# Patient Record
Sex: Male | Born: 1950 | Race: Black or African American | Hispanic: No | Smoking: Never smoker
Health system: Southern US, Community
[De-identification: ages and names within clinical notes are randomized; demographics above are authoritative.]

## PROBLEM LIST (undated history)

## (undated) DIAGNOSIS — K4090 Unilateral inguinal hernia, without obstruction or gangrene, not specified as recurrent: Secondary | ICD-10-CM

## (undated) DIAGNOSIS — J309 Allergic rhinitis, unspecified: Secondary | ICD-10-CM

## (undated) DIAGNOSIS — H409 Unspecified glaucoma: Secondary | ICD-10-CM

## (undated) DIAGNOSIS — R718 Other abnormality of red blood cells: Secondary | ICD-10-CM

## (undated) DIAGNOSIS — M1990 Unspecified osteoarthritis, unspecified site: Secondary | ICD-10-CM

## (undated) DIAGNOSIS — G4700 Insomnia, unspecified: Secondary | ICD-10-CM

## (undated) DIAGNOSIS — D563 Thalassemia minor: Secondary | ICD-10-CM

## (undated) DIAGNOSIS — E213 Hyperparathyroidism, unspecified: Secondary | ICD-10-CM

## (undated) DIAGNOSIS — H34232 Retinal artery branch occlusion, left eye: Secondary | ICD-10-CM

## (undated) DIAGNOSIS — C61 Malignant neoplasm of prostate: Secondary | ICD-10-CM

## (undated) DIAGNOSIS — Z87442 Personal history of urinary calculi: Secondary | ICD-10-CM

## (undated) DIAGNOSIS — I10 Essential (primary) hypertension: Secondary | ICD-10-CM

## (undated) DIAGNOSIS — N189 Chronic kidney disease, unspecified: Secondary | ICD-10-CM

## (undated) DIAGNOSIS — H548 Legal blindness, as defined in USA: Secondary | ICD-10-CM

## (undated) DIAGNOSIS — E785 Hyperlipidemia, unspecified: Secondary | ICD-10-CM

## (undated) DIAGNOSIS — R972 Elevated prostate specific antigen [PSA]: Secondary | ICD-10-CM

## (undated) DIAGNOSIS — D72820 Lymphocytosis (symptomatic): Secondary | ICD-10-CM

## (undated) DIAGNOSIS — N402 Nodular prostate without lower urinary tract symptoms: Secondary | ICD-10-CM

## (undated) DIAGNOSIS — E559 Vitamin D deficiency, unspecified: Secondary | ICD-10-CM

## (undated) HISTORY — DX: Malignant neoplasm of prostate: C61

## (undated) HISTORY — DX: Nodular prostate without lower urinary tract symptoms: N40.2

## (undated) HISTORY — DX: Chronic kidney disease, unspecified: N18.9

## (undated) HISTORY — DX: Unspecified glaucoma: H40.9

## (undated) HISTORY — DX: Hyperparathyroidism, unspecified: E21.3

## (undated) HISTORY — DX: Legal blindness, as defined in USA: H54.8

## (undated) HISTORY — DX: Essential (primary) hypertension: I10

## (undated) HISTORY — DX: Other abnormality of red blood cells: R71.8

## (undated) HISTORY — DX: Personal history of urinary calculi: Z87.442

## (undated) HISTORY — DX: Vitamin D deficiency, unspecified: E55.9

## (undated) HISTORY — DX: Elevated prostate specific antigen (PSA): R97.20

## (undated) HISTORY — DX: Unilateral inguinal hernia, without obstruction or gangrene, not specified as recurrent: K40.90

## (undated) HISTORY — PX: NO PAST SURGERIES: SHX2092

## (undated) HISTORY — DX: Hyperlipidemia, unspecified: E78.5

## (undated) HISTORY — DX: Lymphocytosis (symptomatic): D72.820

## (undated) HISTORY — DX: Other disorders of bilirubin metabolism: E80.6

---

## 2013-12-28 DIAGNOSIS — H409 Unspecified glaucoma: Secondary | ICD-10-CM | POA: Insufficient documentation

## 2014-05-30 DIAGNOSIS — H4010X0 Unspecified open-angle glaucoma, stage unspecified: Secondary | ICD-10-CM | POA: Diagnosis not present

## 2014-07-10 DIAGNOSIS — I10 Essential (primary) hypertension: Secondary | ICD-10-CM | POA: Diagnosis not present

## 2014-07-10 DIAGNOSIS — Z0000 Encounter for general adult medical examination without abnormal findings: Secondary | ICD-10-CM | POA: Diagnosis not present

## 2014-07-10 DIAGNOSIS — E559 Vitamin D deficiency, unspecified: Secondary | ICD-10-CM | POA: Diagnosis not present

## 2014-07-10 DIAGNOSIS — J019 Acute sinusitis, unspecified: Secondary | ICD-10-CM | POA: Diagnosis not present

## 2014-07-10 DIAGNOSIS — E780 Pure hypercholesterolemia: Secondary | ICD-10-CM | POA: Diagnosis not present

## 2014-07-10 DIAGNOSIS — R195 Other fecal abnormalities: Secondary | ICD-10-CM | POA: Diagnosis not present

## 2014-07-10 DIAGNOSIS — Z9181 History of falling: Secondary | ICD-10-CM | POA: Diagnosis not present

## 2014-07-10 DIAGNOSIS — R972 Elevated prostate specific antigen [PSA]: Secondary | ICD-10-CM | POA: Diagnosis not present

## 2014-07-20 DIAGNOSIS — R972 Elevated prostate specific antigen [PSA]: Secondary | ICD-10-CM | POA: Diagnosis not present

## 2014-07-20 DIAGNOSIS — K4090 Unilateral inguinal hernia, without obstruction or gangrene, not specified as recurrent: Secondary | ICD-10-CM | POA: Diagnosis not present

## 2014-07-20 DIAGNOSIS — N402 Nodular prostate without lower urinary tract symptoms: Secondary | ICD-10-CM | POA: Diagnosis not present

## 2014-07-23 DIAGNOSIS — K828 Other specified diseases of gallbladder: Secondary | ICD-10-CM | POA: Diagnosis not present

## 2014-07-23 DIAGNOSIS — K7689 Other specified diseases of liver: Secondary | ICD-10-CM | POA: Diagnosis not present

## 2014-08-07 DIAGNOSIS — C61 Malignant neoplasm of prostate: Secondary | ICD-10-CM | POA: Diagnosis not present

## 2014-08-07 DIAGNOSIS — R972 Elevated prostate specific antigen [PSA]: Secondary | ICD-10-CM | POA: Diagnosis not present

## 2014-08-17 DIAGNOSIS — C61 Malignant neoplasm of prostate: Secondary | ICD-10-CM | POA: Diagnosis not present

## 2014-08-17 DIAGNOSIS — K4090 Unilateral inguinal hernia, without obstruction or gangrene, not specified as recurrent: Secondary | ICD-10-CM | POA: Diagnosis not present

## 2014-08-21 ENCOUNTER — Ambulatory Visit: Payer: Self-pay | Admitting: Urology

## 2014-08-21 DIAGNOSIS — N200 Calculus of kidney: Secondary | ICD-10-CM | POA: Diagnosis not present

## 2014-08-21 DIAGNOSIS — K4090 Unilateral inguinal hernia, without obstruction or gangrene, not specified as recurrent: Secondary | ICD-10-CM | POA: Diagnosis not present

## 2014-08-21 DIAGNOSIS — C61 Malignant neoplasm of prostate: Secondary | ICD-10-CM | POA: Diagnosis not present

## 2014-08-29 DIAGNOSIS — H4011X3 Primary open-angle glaucoma, severe stage: Secondary | ICD-10-CM | POA: Diagnosis not present

## 2014-09-06 ENCOUNTER — Ambulatory Visit: Payer: Self-pay | Admitting: Oncology

## 2014-09-06 DIAGNOSIS — Z7982 Long term (current) use of aspirin: Secondary | ICD-10-CM | POA: Diagnosis not present

## 2014-09-06 DIAGNOSIS — C61 Malignant neoplasm of prostate: Secondary | ICD-10-CM | POA: Diagnosis not present

## 2014-09-06 DIAGNOSIS — Z79899 Other long term (current) drug therapy: Secondary | ICD-10-CM | POA: Diagnosis not present

## 2014-09-06 DIAGNOSIS — R350 Frequency of micturition: Secondary | ICD-10-CM | POA: Diagnosis not present

## 2014-09-06 DIAGNOSIS — N3941 Urge incontinence: Secondary | ICD-10-CM | POA: Diagnosis not present

## 2014-09-06 DIAGNOSIS — E213 Hyperparathyroidism, unspecified: Secondary | ICD-10-CM | POA: Diagnosis not present

## 2014-09-06 DIAGNOSIS — N289 Disorder of kidney and ureter, unspecified: Secondary | ICD-10-CM | POA: Diagnosis not present

## 2014-09-06 DIAGNOSIS — F419 Anxiety disorder, unspecified: Secondary | ICD-10-CM | POA: Diagnosis not present

## 2014-09-06 DIAGNOSIS — R351 Nocturia: Secondary | ICD-10-CM | POA: Diagnosis not present

## 2014-09-06 DIAGNOSIS — Z79818 Long term (current) use of other agents affecting estrogen receptors and estrogen levels: Secondary | ICD-10-CM | POA: Diagnosis not present

## 2014-09-06 DIAGNOSIS — R972 Elevated prostate specific antigen [PSA]: Secondary | ICD-10-CM | POA: Diagnosis not present

## 2014-09-06 LAB — COMPREHENSIVE METABOLIC PANEL
AST: 25 U/L (ref 15–37)
Albumin: 3.8 g/dL (ref 3.4–5.0)
Alkaline Phosphatase: 45 U/L — ABNORMAL LOW
Anion Gap: 9 (ref 7–16)
BUN: 36 mg/dL — ABNORMAL HIGH (ref 7–18)
Bilirubin,Total: 2.9 mg/dL — ABNORMAL HIGH (ref 0.2–1.0)
CREATININE: 2.09 mg/dL — AB (ref 0.60–1.30)
Calcium, Total: 10.9 mg/dL — ABNORMAL HIGH (ref 8.5–10.1)
Chloride: 104 mmol/L (ref 98–107)
Co2: 29 mmol/L (ref 21–32)
EGFR (Non-African Amer.): 34 — ABNORMAL LOW
GFR CALC AF AMER: 41 — AB
Glucose: 109 mg/dL — ABNORMAL HIGH (ref 65–99)
OSMOLALITY: 292 (ref 275–301)
Potassium: 3.9 mmol/L (ref 3.5–5.1)
SGPT (ALT): 52 U/L
SODIUM: 142 mmol/L (ref 136–145)
TOTAL PROTEIN: 8 g/dL (ref 6.4–8.2)

## 2014-09-06 LAB — CBC CANCER CENTER
BASOS PCT: 1.5 %
Basophil #: 0.1 x10 3/mm (ref 0.0–0.1)
EOS ABS: 0.3 x10 3/mm (ref 0.0–0.7)
EOS PCT: 5 %
HCT: 46.8 % (ref 40.0–52.0)
HGB: 14.1 g/dL (ref 13.0–18.0)
LYMPHS PCT: 33.1 %
Lymphocyte #: 2.2 x10 3/mm (ref 1.0–3.6)
MCH: 21 pg — ABNORMAL LOW (ref 26.0–34.0)
MCHC: 30.2 g/dL — AB (ref 32.0–36.0)
MCV: 70 fL — AB (ref 80–100)
Monocyte #: 0.6 x10 3/mm (ref 0.2–1.0)
Monocyte %: 8.3 %
Neutrophil #: 3.5 x10 3/mm (ref 1.4–6.5)
Neutrophil %: 52.1 %
Platelet: 190 x10 3/mm (ref 150–440)
RBC: 6.73 10*6/uL — ABNORMAL HIGH (ref 4.40–5.90)
RDW: 15.8 % — AB (ref 11.5–14.5)
WBC: 6.7 x10 3/mm (ref 3.8–10.6)

## 2014-09-07 LAB — PSA: PSA: 24.6 ng/mL — ABNORMAL HIGH (ref 0.0–4.0)

## 2014-09-28 ENCOUNTER — Ambulatory Visit: Payer: Self-pay | Admitting: Oncology

## 2014-09-28 DIAGNOSIS — Z7982 Long term (current) use of aspirin: Secondary | ICD-10-CM | POA: Diagnosis not present

## 2014-09-28 DIAGNOSIS — R351 Nocturia: Secondary | ICD-10-CM | POA: Diagnosis not present

## 2014-09-28 DIAGNOSIS — R972 Elevated prostate specific antigen [PSA]: Secondary | ICD-10-CM | POA: Diagnosis not present

## 2014-09-28 DIAGNOSIS — R5383 Other fatigue: Secondary | ICD-10-CM | POA: Diagnosis not present

## 2014-09-28 DIAGNOSIS — Z79818 Long term (current) use of other agents affecting estrogen receptors and estrogen levels: Secondary | ICD-10-CM | POA: Diagnosis not present

## 2014-09-28 DIAGNOSIS — E213 Hyperparathyroidism, unspecified: Secondary | ICD-10-CM | POA: Diagnosis not present

## 2014-09-28 DIAGNOSIS — N289 Disorder of kidney and ureter, unspecified: Secondary | ICD-10-CM | POA: Diagnosis not present

## 2014-09-28 DIAGNOSIS — Z79899 Other long term (current) drug therapy: Secondary | ICD-10-CM | POA: Diagnosis not present

## 2014-09-28 DIAGNOSIS — R531 Weakness: Secondary | ICD-10-CM | POA: Diagnosis not present

## 2014-09-28 DIAGNOSIS — F419 Anxiety disorder, unspecified: Secondary | ICD-10-CM | POA: Diagnosis not present

## 2014-09-28 DIAGNOSIS — C61 Malignant neoplasm of prostate: Secondary | ICD-10-CM | POA: Diagnosis not present

## 2014-09-28 DIAGNOSIS — Z510 Encounter for antineoplastic radiation therapy: Secondary | ICD-10-CM | POA: Diagnosis not present

## 2014-09-28 DIAGNOSIS — Z8546 Personal history of malignant neoplasm of prostate: Secondary | ICD-10-CM | POA: Diagnosis not present

## 2014-10-04 DIAGNOSIS — H4011X3 Primary open-angle glaucoma, severe stage: Secondary | ICD-10-CM | POA: Diagnosis not present

## 2014-10-09 DIAGNOSIS — C61 Malignant neoplasm of prostate: Secondary | ICD-10-CM | POA: Diagnosis not present

## 2014-10-11 DIAGNOSIS — N289 Disorder of kidney and ureter, unspecified: Secondary | ICD-10-CM | POA: Diagnosis not present

## 2014-10-11 DIAGNOSIS — Z510 Encounter for antineoplastic radiation therapy: Secondary | ICD-10-CM | POA: Diagnosis not present

## 2014-10-11 DIAGNOSIS — Z79818 Long term (current) use of other agents affecting estrogen receptors and estrogen levels: Secondary | ICD-10-CM | POA: Diagnosis not present

## 2014-10-11 DIAGNOSIS — R972 Elevated prostate specific antigen [PSA]: Secondary | ICD-10-CM | POA: Diagnosis not present

## 2014-10-11 DIAGNOSIS — C61 Malignant neoplasm of prostate: Secondary | ICD-10-CM | POA: Diagnosis not present

## 2014-10-11 LAB — COMPREHENSIVE METABOLIC PANEL
Albumin: 3.5 g/dL (ref 3.4–5.0)
Alkaline Phosphatase: 49 U/L
Anion Gap: 10 (ref 7–16)
BUN: 49 mg/dL — ABNORMAL HIGH (ref 7–18)
Bilirubin,Total: 2.6 mg/dL — ABNORMAL HIGH (ref 0.2–1.0)
Calcium, Total: 10.2 mg/dL — ABNORMAL HIGH (ref 8.5–10.1)
Chloride: 110 mmol/L — ABNORMAL HIGH (ref 98–107)
Co2: 23 mmol/L (ref 21–32)
Creatinine: 2.29 mg/dL — ABNORMAL HIGH (ref 0.60–1.30)
EGFR (African American): 37 — ABNORMAL LOW
EGFR (Non-African Amer.): 31 — ABNORMAL LOW
Glucose: 104 mg/dL — ABNORMAL HIGH (ref 65–99)
Osmolality: 298 (ref 275–301)
Potassium: 4.1 mmol/L (ref 3.5–5.1)
SGOT(AST): 21 U/L (ref 15–37)
SGPT (ALT): 41 U/L
Sodium: 143 mmol/L (ref 136–145)
Total Protein: 7.7 g/dL (ref 6.4–8.2)

## 2014-10-11 LAB — CBC CANCER CENTER
BASOS PCT: 1.3 %
Basophil #: 0.1 x10 3/mm (ref 0.0–0.1)
EOS PCT: 3.7 %
Eosinophil #: 0.2 x10 3/mm (ref 0.0–0.7)
HCT: 43 % (ref 40.0–52.0)
HGB: 13.5 g/dL (ref 13.0–18.0)
Lymphocyte #: 2.5 x10 3/mm (ref 1.0–3.6)
Lymphocyte %: 38.4 %
MCH: 21.3 pg — AB (ref 26.0–34.0)
MCHC: 31.3 g/dL — ABNORMAL LOW (ref 32.0–36.0)
MCV: 68 fL — AB (ref 80–100)
MONO ABS: 0.5 x10 3/mm (ref 0.2–1.0)
Monocyte %: 7.9 %
Neutrophil #: 3.1 x10 3/mm (ref 1.4–6.5)
Neutrophil %: 48.7 %
Platelet: 204 x10 3/mm (ref 150–440)
RBC: 6.33 10*6/uL — ABNORMAL HIGH (ref 4.40–5.90)
RDW: 15.4 % — ABNORMAL HIGH (ref 11.5–14.5)
WBC: 6.4 x10 3/mm (ref 3.8–10.6)

## 2014-10-15 LAB — PSA: PSA: 12.8 ng/mL — ABNORMAL HIGH (ref 0.0–4.0)

## 2014-10-16 DIAGNOSIS — Z510 Encounter for antineoplastic radiation therapy: Secondary | ICD-10-CM | POA: Diagnosis not present

## 2014-10-16 DIAGNOSIS — N289 Disorder of kidney and ureter, unspecified: Secondary | ICD-10-CM | POA: Diagnosis not present

## 2014-10-16 DIAGNOSIS — Z79818 Long term (current) use of other agents affecting estrogen receptors and estrogen levels: Secondary | ICD-10-CM | POA: Diagnosis not present

## 2014-10-16 DIAGNOSIS — R972 Elevated prostate specific antigen [PSA]: Secondary | ICD-10-CM | POA: Diagnosis not present

## 2014-10-16 DIAGNOSIS — C61 Malignant neoplasm of prostate: Secondary | ICD-10-CM | POA: Diagnosis not present

## 2014-10-17 DIAGNOSIS — R972 Elevated prostate specific antigen [PSA]: Secondary | ICD-10-CM | POA: Diagnosis not present

## 2014-10-17 DIAGNOSIS — Z510 Encounter for antineoplastic radiation therapy: Secondary | ICD-10-CM | POA: Diagnosis not present

## 2014-10-17 DIAGNOSIS — C61 Malignant neoplasm of prostate: Secondary | ICD-10-CM | POA: Diagnosis not present

## 2014-10-17 DIAGNOSIS — Z79818 Long term (current) use of other agents affecting estrogen receptors and estrogen levels: Secondary | ICD-10-CM | POA: Diagnosis not present

## 2014-10-17 DIAGNOSIS — N289 Disorder of kidney and ureter, unspecified: Secondary | ICD-10-CM | POA: Diagnosis not present

## 2014-10-22 DIAGNOSIS — Z79818 Long term (current) use of other agents affecting estrogen receptors and estrogen levels: Secondary | ICD-10-CM | POA: Diagnosis not present

## 2014-10-22 DIAGNOSIS — N289 Disorder of kidney and ureter, unspecified: Secondary | ICD-10-CM | POA: Diagnosis not present

## 2014-10-22 DIAGNOSIS — C61 Malignant neoplasm of prostate: Secondary | ICD-10-CM | POA: Diagnosis not present

## 2014-10-22 DIAGNOSIS — Z510 Encounter for antineoplastic radiation therapy: Secondary | ICD-10-CM | POA: Diagnosis not present

## 2014-10-22 DIAGNOSIS — R972 Elevated prostate specific antigen [PSA]: Secondary | ICD-10-CM | POA: Diagnosis not present

## 2014-10-26 DIAGNOSIS — N289 Disorder of kidney and ureter, unspecified: Secondary | ICD-10-CM | POA: Diagnosis not present

## 2014-10-26 DIAGNOSIS — Z510 Encounter for antineoplastic radiation therapy: Secondary | ICD-10-CM | POA: Diagnosis not present

## 2014-10-26 DIAGNOSIS — R972 Elevated prostate specific antigen [PSA]: Secondary | ICD-10-CM | POA: Diagnosis not present

## 2014-10-26 DIAGNOSIS — C61 Malignant neoplasm of prostate: Secondary | ICD-10-CM | POA: Diagnosis not present

## 2014-10-26 DIAGNOSIS — Z79818 Long term (current) use of other agents affecting estrogen receptors and estrogen levels: Secondary | ICD-10-CM | POA: Diagnosis not present

## 2014-10-29 ENCOUNTER — Ambulatory Visit: Payer: Self-pay | Admitting: Oncology

## 2014-10-29 DIAGNOSIS — C61 Malignant neoplasm of prostate: Secondary | ICD-10-CM | POA: Diagnosis not present

## 2014-10-29 DIAGNOSIS — Z510 Encounter for antineoplastic radiation therapy: Secondary | ICD-10-CM | POA: Diagnosis not present

## 2014-10-29 DIAGNOSIS — Z79818 Long term (current) use of other agents affecting estrogen receptors and estrogen levels: Secondary | ICD-10-CM | POA: Diagnosis not present

## 2014-10-30 DIAGNOSIS — Z510 Encounter for antineoplastic radiation therapy: Secondary | ICD-10-CM | POA: Diagnosis not present

## 2014-10-30 DIAGNOSIS — C61 Malignant neoplasm of prostate: Secondary | ICD-10-CM | POA: Diagnosis not present

## 2014-10-30 DIAGNOSIS — Z79818 Long term (current) use of other agents affecting estrogen receptors and estrogen levels: Secondary | ICD-10-CM | POA: Diagnosis not present

## 2014-10-31 DIAGNOSIS — Z510 Encounter for antineoplastic radiation therapy: Secondary | ICD-10-CM | POA: Diagnosis not present

## 2014-10-31 DIAGNOSIS — Z79818 Long term (current) use of other agents affecting estrogen receptors and estrogen levels: Secondary | ICD-10-CM | POA: Diagnosis not present

## 2014-10-31 DIAGNOSIS — C61 Malignant neoplasm of prostate: Secondary | ICD-10-CM | POA: Diagnosis not present

## 2014-11-01 DIAGNOSIS — Z79818 Long term (current) use of other agents affecting estrogen receptors and estrogen levels: Secondary | ICD-10-CM | POA: Diagnosis not present

## 2014-11-01 DIAGNOSIS — Z510 Encounter for antineoplastic radiation therapy: Secondary | ICD-10-CM | POA: Diagnosis not present

## 2014-11-01 DIAGNOSIS — C61 Malignant neoplasm of prostate: Secondary | ICD-10-CM | POA: Diagnosis not present

## 2014-11-05 DIAGNOSIS — C61 Malignant neoplasm of prostate: Secondary | ICD-10-CM | POA: Diagnosis not present

## 2014-11-05 DIAGNOSIS — Z510 Encounter for antineoplastic radiation therapy: Secondary | ICD-10-CM | POA: Diagnosis not present

## 2014-11-05 DIAGNOSIS — Z79818 Long term (current) use of other agents affecting estrogen receptors and estrogen levels: Secondary | ICD-10-CM | POA: Diagnosis not present

## 2014-11-06 DIAGNOSIS — C61 Malignant neoplasm of prostate: Secondary | ICD-10-CM | POA: Diagnosis not present

## 2014-11-06 DIAGNOSIS — Z79818 Long term (current) use of other agents affecting estrogen receptors and estrogen levels: Secondary | ICD-10-CM | POA: Diagnosis not present

## 2014-11-06 DIAGNOSIS — Z510 Encounter for antineoplastic radiation therapy: Secondary | ICD-10-CM | POA: Diagnosis not present

## 2014-11-07 DIAGNOSIS — C61 Malignant neoplasm of prostate: Secondary | ICD-10-CM | POA: Diagnosis not present

## 2014-11-07 DIAGNOSIS — Z79818 Long term (current) use of other agents affecting estrogen receptors and estrogen levels: Secondary | ICD-10-CM | POA: Diagnosis not present

## 2014-11-07 DIAGNOSIS — Z510 Encounter for antineoplastic radiation therapy: Secondary | ICD-10-CM | POA: Diagnosis not present

## 2014-11-08 DIAGNOSIS — C61 Malignant neoplasm of prostate: Secondary | ICD-10-CM | POA: Diagnosis not present

## 2014-11-08 DIAGNOSIS — Z510 Encounter for antineoplastic radiation therapy: Secondary | ICD-10-CM | POA: Diagnosis not present

## 2014-11-08 DIAGNOSIS — Z79818 Long term (current) use of other agents affecting estrogen receptors and estrogen levels: Secondary | ICD-10-CM | POA: Diagnosis not present

## 2014-11-09 DIAGNOSIS — C61 Malignant neoplasm of prostate: Secondary | ICD-10-CM | POA: Diagnosis not present

## 2014-11-09 DIAGNOSIS — Z79818 Long term (current) use of other agents affecting estrogen receptors and estrogen levels: Secondary | ICD-10-CM | POA: Diagnosis not present

## 2014-11-09 DIAGNOSIS — Z510 Encounter for antineoplastic radiation therapy: Secondary | ICD-10-CM | POA: Diagnosis not present

## 2014-11-13 DIAGNOSIS — C61 Malignant neoplasm of prostate: Secondary | ICD-10-CM | POA: Diagnosis not present

## 2014-11-13 DIAGNOSIS — Z79818 Long term (current) use of other agents affecting estrogen receptors and estrogen levels: Secondary | ICD-10-CM | POA: Diagnosis not present

## 2014-11-13 DIAGNOSIS — Z510 Encounter for antineoplastic radiation therapy: Secondary | ICD-10-CM | POA: Diagnosis not present

## 2014-11-14 DIAGNOSIS — C61 Malignant neoplasm of prostate: Secondary | ICD-10-CM | POA: Diagnosis not present

## 2014-11-14 DIAGNOSIS — Z510 Encounter for antineoplastic radiation therapy: Secondary | ICD-10-CM | POA: Diagnosis not present

## 2014-11-14 DIAGNOSIS — Z79818 Long term (current) use of other agents affecting estrogen receptors and estrogen levels: Secondary | ICD-10-CM | POA: Diagnosis not present

## 2014-11-15 DIAGNOSIS — Z510 Encounter for antineoplastic radiation therapy: Secondary | ICD-10-CM | POA: Diagnosis not present

## 2014-11-15 DIAGNOSIS — C61 Malignant neoplasm of prostate: Secondary | ICD-10-CM | POA: Diagnosis not present

## 2014-11-15 DIAGNOSIS — Z79818 Long term (current) use of other agents affecting estrogen receptors and estrogen levels: Secondary | ICD-10-CM | POA: Diagnosis not present

## 2014-11-16 DIAGNOSIS — Z510 Encounter for antineoplastic radiation therapy: Secondary | ICD-10-CM | POA: Diagnosis not present

## 2014-11-16 DIAGNOSIS — C61 Malignant neoplasm of prostate: Secondary | ICD-10-CM | POA: Diagnosis not present

## 2014-11-16 DIAGNOSIS — Z79818 Long term (current) use of other agents affecting estrogen receptors and estrogen levels: Secondary | ICD-10-CM | POA: Diagnosis not present

## 2014-11-19 DIAGNOSIS — Z510 Encounter for antineoplastic radiation therapy: Secondary | ICD-10-CM | POA: Diagnosis not present

## 2014-11-19 DIAGNOSIS — Z79818 Long term (current) use of other agents affecting estrogen receptors and estrogen levels: Secondary | ICD-10-CM | POA: Diagnosis not present

## 2014-11-19 DIAGNOSIS — C61 Malignant neoplasm of prostate: Secondary | ICD-10-CM | POA: Diagnosis not present

## 2014-11-20 DIAGNOSIS — Z79818 Long term (current) use of other agents affecting estrogen receptors and estrogen levels: Secondary | ICD-10-CM | POA: Diagnosis not present

## 2014-11-20 DIAGNOSIS — Z510 Encounter for antineoplastic radiation therapy: Secondary | ICD-10-CM | POA: Diagnosis not present

## 2014-11-20 DIAGNOSIS — C61 Malignant neoplasm of prostate: Secondary | ICD-10-CM | POA: Diagnosis not present

## 2014-11-21 DIAGNOSIS — Z79818 Long term (current) use of other agents affecting estrogen receptors and estrogen levels: Secondary | ICD-10-CM | POA: Diagnosis not present

## 2014-11-21 DIAGNOSIS — Z510 Encounter for antineoplastic radiation therapy: Secondary | ICD-10-CM | POA: Diagnosis not present

## 2014-11-21 DIAGNOSIS — C61 Malignant neoplasm of prostate: Secondary | ICD-10-CM | POA: Diagnosis not present

## 2014-11-22 DIAGNOSIS — C61 Malignant neoplasm of prostate: Secondary | ICD-10-CM | POA: Diagnosis not present

## 2014-11-22 DIAGNOSIS — Z79818 Long term (current) use of other agents affecting estrogen receptors and estrogen levels: Secondary | ICD-10-CM | POA: Diagnosis not present

## 2014-11-22 DIAGNOSIS — Z510 Encounter for antineoplastic radiation therapy: Secondary | ICD-10-CM | POA: Diagnosis not present

## 2014-11-23 DIAGNOSIS — Z510 Encounter for antineoplastic radiation therapy: Secondary | ICD-10-CM | POA: Diagnosis not present

## 2014-11-23 DIAGNOSIS — Z79818 Long term (current) use of other agents affecting estrogen receptors and estrogen levels: Secondary | ICD-10-CM | POA: Diagnosis not present

## 2014-11-23 DIAGNOSIS — C61 Malignant neoplasm of prostate: Secondary | ICD-10-CM | POA: Diagnosis not present

## 2014-11-26 DIAGNOSIS — Z510 Encounter for antineoplastic radiation therapy: Secondary | ICD-10-CM | POA: Diagnosis not present

## 2014-11-26 DIAGNOSIS — C61 Malignant neoplasm of prostate: Secondary | ICD-10-CM | POA: Diagnosis not present

## 2014-11-26 DIAGNOSIS — Z79818 Long term (current) use of other agents affecting estrogen receptors and estrogen levels: Secondary | ICD-10-CM | POA: Diagnosis not present

## 2014-11-27 ENCOUNTER — Ambulatory Visit
Admit: 2014-11-27 | Disposition: A | Discharge status: Home / Self Care | Payer: Self-pay | Attending: Oncology | Admitting: Oncology

## 2014-11-27 DIAGNOSIS — C61 Malignant neoplasm of prostate: Secondary | ICD-10-CM | POA: Diagnosis not present

## 2014-11-27 DIAGNOSIS — Z510 Encounter for antineoplastic radiation therapy: Secondary | ICD-10-CM | POA: Diagnosis not present

## 2014-11-27 DIAGNOSIS — Z79818 Long term (current) use of other agents affecting estrogen receptors and estrogen levels: Secondary | ICD-10-CM | POA: Diagnosis not present

## 2014-11-28 DIAGNOSIS — Z510 Encounter for antineoplastic radiation therapy: Secondary | ICD-10-CM | POA: Diagnosis not present

## 2014-11-28 DIAGNOSIS — C61 Malignant neoplasm of prostate: Secondary | ICD-10-CM | POA: Diagnosis not present

## 2014-11-28 DIAGNOSIS — Z79818 Long term (current) use of other agents affecting estrogen receptors and estrogen levels: Secondary | ICD-10-CM | POA: Diagnosis not present

## 2014-11-29 DIAGNOSIS — Z510 Encounter for antineoplastic radiation therapy: Secondary | ICD-10-CM | POA: Diagnosis not present

## 2014-11-29 DIAGNOSIS — Z79818 Long term (current) use of other agents affecting estrogen receptors and estrogen levels: Secondary | ICD-10-CM | POA: Diagnosis not present

## 2014-11-29 DIAGNOSIS — C61 Malignant neoplasm of prostate: Secondary | ICD-10-CM | POA: Diagnosis not present

## 2014-11-30 DIAGNOSIS — Z79818 Long term (current) use of other agents affecting estrogen receptors and estrogen levels: Secondary | ICD-10-CM | POA: Diagnosis not present

## 2014-11-30 DIAGNOSIS — C61 Malignant neoplasm of prostate: Secondary | ICD-10-CM | POA: Diagnosis not present

## 2014-11-30 DIAGNOSIS — Z510 Encounter for antineoplastic radiation therapy: Secondary | ICD-10-CM | POA: Diagnosis not present

## 2014-12-03 DIAGNOSIS — C61 Malignant neoplasm of prostate: Secondary | ICD-10-CM | POA: Diagnosis not present

## 2014-12-03 DIAGNOSIS — Z510 Encounter for antineoplastic radiation therapy: Secondary | ICD-10-CM | POA: Diagnosis not present

## 2014-12-03 DIAGNOSIS — Z79818 Long term (current) use of other agents affecting estrogen receptors and estrogen levels: Secondary | ICD-10-CM | POA: Diagnosis not present

## 2014-12-04 DIAGNOSIS — Z79818 Long term (current) use of other agents affecting estrogen receptors and estrogen levels: Secondary | ICD-10-CM | POA: Diagnosis not present

## 2014-12-04 DIAGNOSIS — Z510 Encounter for antineoplastic radiation therapy: Secondary | ICD-10-CM | POA: Diagnosis not present

## 2014-12-04 DIAGNOSIS — C61 Malignant neoplasm of prostate: Secondary | ICD-10-CM | POA: Diagnosis not present

## 2014-12-05 DIAGNOSIS — Z510 Encounter for antineoplastic radiation therapy: Secondary | ICD-10-CM | POA: Diagnosis not present

## 2014-12-05 DIAGNOSIS — C61 Malignant neoplasm of prostate: Secondary | ICD-10-CM | POA: Diagnosis not present

## 2014-12-05 DIAGNOSIS — Z79818 Long term (current) use of other agents affecting estrogen receptors and estrogen levels: Secondary | ICD-10-CM | POA: Diagnosis not present

## 2014-12-06 DIAGNOSIS — Z79818 Long term (current) use of other agents affecting estrogen receptors and estrogen levels: Secondary | ICD-10-CM | POA: Diagnosis not present

## 2014-12-06 DIAGNOSIS — C61 Malignant neoplasm of prostate: Secondary | ICD-10-CM | POA: Diagnosis not present

## 2014-12-06 DIAGNOSIS — Z510 Encounter for antineoplastic radiation therapy: Secondary | ICD-10-CM | POA: Diagnosis not present

## 2014-12-07 DIAGNOSIS — Z510 Encounter for antineoplastic radiation therapy: Secondary | ICD-10-CM | POA: Diagnosis not present

## 2014-12-07 DIAGNOSIS — C61 Malignant neoplasm of prostate: Secondary | ICD-10-CM | POA: Diagnosis not present

## 2014-12-07 DIAGNOSIS — Z79818 Long term (current) use of other agents affecting estrogen receptors and estrogen levels: Secondary | ICD-10-CM | POA: Diagnosis not present

## 2014-12-10 DIAGNOSIS — C61 Malignant neoplasm of prostate: Secondary | ICD-10-CM | POA: Diagnosis not present

## 2014-12-10 DIAGNOSIS — Z79818 Long term (current) use of other agents affecting estrogen receptors and estrogen levels: Secondary | ICD-10-CM | POA: Diagnosis not present

## 2014-12-10 DIAGNOSIS — Z510 Encounter for antineoplastic radiation therapy: Secondary | ICD-10-CM | POA: Diagnosis not present

## 2014-12-11 DIAGNOSIS — Z79818 Long term (current) use of other agents affecting estrogen receptors and estrogen levels: Secondary | ICD-10-CM | POA: Diagnosis not present

## 2014-12-11 DIAGNOSIS — C61 Malignant neoplasm of prostate: Secondary | ICD-10-CM | POA: Diagnosis not present

## 2014-12-11 DIAGNOSIS — Z510 Encounter for antineoplastic radiation therapy: Secondary | ICD-10-CM | POA: Diagnosis not present

## 2014-12-12 DIAGNOSIS — C61 Malignant neoplasm of prostate: Secondary | ICD-10-CM | POA: Diagnosis not present

## 2014-12-12 DIAGNOSIS — Z79818 Long term (current) use of other agents affecting estrogen receptors and estrogen levels: Secondary | ICD-10-CM | POA: Diagnosis not present

## 2014-12-12 DIAGNOSIS — Z510 Encounter for antineoplastic radiation therapy: Secondary | ICD-10-CM | POA: Diagnosis not present

## 2014-12-13 DIAGNOSIS — Z510 Encounter for antineoplastic radiation therapy: Secondary | ICD-10-CM | POA: Diagnosis not present

## 2014-12-13 DIAGNOSIS — C61 Malignant neoplasm of prostate: Secondary | ICD-10-CM | POA: Diagnosis not present

## 2014-12-13 DIAGNOSIS — Z79818 Long term (current) use of other agents affecting estrogen receptors and estrogen levels: Secondary | ICD-10-CM | POA: Diagnosis not present

## 2014-12-14 DIAGNOSIS — C61 Malignant neoplasm of prostate: Secondary | ICD-10-CM | POA: Diagnosis not present

## 2014-12-14 DIAGNOSIS — Z510 Encounter for antineoplastic radiation therapy: Secondary | ICD-10-CM | POA: Diagnosis not present

## 2014-12-14 DIAGNOSIS — Z79818 Long term (current) use of other agents affecting estrogen receptors and estrogen levels: Secondary | ICD-10-CM | POA: Diagnosis not present

## 2014-12-17 DIAGNOSIS — Z510 Encounter for antineoplastic radiation therapy: Secondary | ICD-10-CM | POA: Diagnosis not present

## 2014-12-17 DIAGNOSIS — C61 Malignant neoplasm of prostate: Secondary | ICD-10-CM | POA: Diagnosis not present

## 2014-12-17 DIAGNOSIS — Z79818 Long term (current) use of other agents affecting estrogen receptors and estrogen levels: Secondary | ICD-10-CM | POA: Diagnosis not present

## 2014-12-18 DIAGNOSIS — Z79818 Long term (current) use of other agents affecting estrogen receptors and estrogen levels: Secondary | ICD-10-CM | POA: Diagnosis not present

## 2014-12-18 DIAGNOSIS — Z510 Encounter for antineoplastic radiation therapy: Secondary | ICD-10-CM | POA: Diagnosis not present

## 2014-12-18 DIAGNOSIS — C61 Malignant neoplasm of prostate: Secondary | ICD-10-CM | POA: Diagnosis not present

## 2014-12-19 DIAGNOSIS — Z79818 Long term (current) use of other agents affecting estrogen receptors and estrogen levels: Secondary | ICD-10-CM | POA: Diagnosis not present

## 2014-12-19 DIAGNOSIS — C61 Malignant neoplasm of prostate: Secondary | ICD-10-CM | POA: Diagnosis not present

## 2014-12-19 DIAGNOSIS — Z510 Encounter for antineoplastic radiation therapy: Secondary | ICD-10-CM | POA: Diagnosis not present

## 2014-12-20 DIAGNOSIS — Z79818 Long term (current) use of other agents affecting estrogen receptors and estrogen levels: Secondary | ICD-10-CM | POA: Diagnosis not present

## 2014-12-20 DIAGNOSIS — C61 Malignant neoplasm of prostate: Secondary | ICD-10-CM | POA: Diagnosis not present

## 2014-12-20 DIAGNOSIS — Z510 Encounter for antineoplastic radiation therapy: Secondary | ICD-10-CM | POA: Diagnosis not present

## 2014-12-21 DIAGNOSIS — Z510 Encounter for antineoplastic radiation therapy: Secondary | ICD-10-CM | POA: Diagnosis not present

## 2014-12-21 DIAGNOSIS — Z79818 Long term (current) use of other agents affecting estrogen receptors and estrogen levels: Secondary | ICD-10-CM | POA: Diagnosis not present

## 2014-12-21 DIAGNOSIS — C61 Malignant neoplasm of prostate: Secondary | ICD-10-CM | POA: Diagnosis not present

## 2014-12-24 DIAGNOSIS — C61 Malignant neoplasm of prostate: Secondary | ICD-10-CM | POA: Diagnosis not present

## 2014-12-24 DIAGNOSIS — Z510 Encounter for antineoplastic radiation therapy: Secondary | ICD-10-CM | POA: Diagnosis not present

## 2014-12-24 DIAGNOSIS — Z79818 Long term (current) use of other agents affecting estrogen receptors and estrogen levels: Secondary | ICD-10-CM | POA: Diagnosis not present

## 2014-12-25 DIAGNOSIS — Z79818 Long term (current) use of other agents affecting estrogen receptors and estrogen levels: Secondary | ICD-10-CM | POA: Diagnosis not present

## 2014-12-25 DIAGNOSIS — Z510 Encounter for antineoplastic radiation therapy: Secondary | ICD-10-CM | POA: Diagnosis not present

## 2014-12-25 DIAGNOSIS — C61 Malignant neoplasm of prostate: Secondary | ICD-10-CM | POA: Diagnosis not present

## 2014-12-28 ENCOUNTER — Ambulatory Visit
Admit: 2014-12-28 | Disposition: A | Discharge status: Home / Self Care | Payer: Self-pay | Attending: Oncology | Admitting: Oncology

## 2014-12-28 DIAGNOSIS — C61 Malignant neoplasm of prostate: Secondary | ICD-10-CM | POA: Diagnosis not present

## 2014-12-28 DIAGNOSIS — Z79818 Long term (current) use of other agents affecting estrogen receptors and estrogen levels: Secondary | ICD-10-CM | POA: Diagnosis not present

## 2015-01-09 DIAGNOSIS — Z6833 Body mass index (BMI) 33.0-33.9, adult: Secondary | ICD-10-CM | POA: Diagnosis not present

## 2015-01-09 DIAGNOSIS — Z1389 Encounter for screening for other disorder: Secondary | ICD-10-CM | POA: Diagnosis not present

## 2015-01-09 DIAGNOSIS — E780 Pure hypercholesterolemia: Secondary | ICD-10-CM | POA: Diagnosis not present

## 2015-01-09 DIAGNOSIS — E559 Vitamin D deficiency, unspecified: Secondary | ICD-10-CM | POA: Diagnosis not present

## 2015-01-09 DIAGNOSIS — I10 Essential (primary) hypertension: Secondary | ICD-10-CM | POA: Diagnosis not present

## 2015-01-09 DIAGNOSIS — E210 Primary hyperparathyroidism: Secondary | ICD-10-CM | POA: Diagnosis not present

## 2015-01-09 DIAGNOSIS — C61 Malignant neoplasm of prostate: Secondary | ICD-10-CM | POA: Diagnosis not present

## 2015-01-17 DIAGNOSIS — H4011X3 Primary open-angle glaucoma, severe stage: Secondary | ICD-10-CM | POA: Diagnosis not present

## 2015-01-19 NOTE — Consult Note (Signed)
Reason for Visit: This 64 year old Male patient presents to the clinic for initial evaluation of  prostate cancer .   Referred by Dr. Erlene Quan.  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 He was seen by urology had 12 transrectal ultrasound-guided biopsies all positive for adenocarcinoma. Tumors were Gleason 7 (4+3) as well as Gleason 6 (3+3) and Gleason 7 (3+4). Underwent CT scan showing asymmetric enlargement of the prostate with protrusion to the bladder base no evidence of metastatic disease. Bone scan was performed showing no evidence of osseous metastasis. Patient has some urinary frequency urgency nocturia 4 and tends towards constipation. He has been seen by medical oncology who is starting him on androgen deprivation therapy. I've been asked to evaluate the patient for possible radiation therapy.  Past Hx:    Prostate Cancer:   Past, Family and Social History:  Past Medical History positive   Cardiovascular hyperlipidemia; hypertension   Genitourinary kidney stones; chronic renal disease   Immunologic atypical leukocytosis   Past Medical History Comments glaucoma, hyper albuminemia, vitamin D deficiency, inguinal hernia, hyperparathyroidism, hypercalcemia   Family History positive   Family History Comments paternal uncle with prostate cancer   Social History noncontributory   Additional Past Medical and Surgical History accompanied by family  member today   Home Meds:  Home Medications: Medication Instructions Status  calcium-vitamin D 600 mg-200 units oral tablet 1 tab(s) orally 2 times a day Active  losartan 100 mg oral tablet 1 tab(s) orally once a day Active  loratadine 10 mg oral tablet 1 tab(s) orally once a day Active  Provastatin Sodium 40 mg - once a day  Active  metoprolol 50 mg oral tablet  orally once a day Active  hydrochlorothiazide 25 mg oral tablet 1 tab(s) orally once a day Active  aspirin 81 mg oral tablet 1 tab(s) orally once a day Active  MiraLax - oral powder for reconstitution Take as directed As Needed Active  Vitamin D 5000 units - once a week  Active   Review of Systems:  General negative   Performance Status (ECOG) 0   Skin negative   Breast negative   Ophthalmologic negative   ENMT negative   Respiratory and Thorax negative   Cardiovascular negative   Gastrointestinal negative   Genitourinary see HPI   Musculoskeletal negative   Neurological negative   Psychiatric negative   Hematology/Lymphatics negative   Endocrine negative   Allergic/Immunologic negative   Review of Systems   denies any weight loss, fatigue, weakness, fever, chills or night sweats. Patient denies any loss of vision, blurred vision. Patient denies any ringing  of the ears or hearing loss. No irregular heartbeat. Patient denies heart murmur or history of fainting. Patient denies any chest pain or pain radiating to her upper extremities. Patient denies any shortness of breath, difficulty breathing at night, cough or hemoptysis. Patient denies any swelling in the lower legs. Patient denies any nausea vomiting, vomiting of blood, or coffee ground  material in the vomitus. Patient denies any stomach pain. Patient states has had normal bowel movements no significant constipation or diarrhea. Patient denies any dysuria, hematuria or significant nocturia. Patient denies any problems walking, swelling in the joints or  loss of balance. Patient denies any skin changes, loss of hair or loss of weight. Patient denies any excessive worrying or anxiety or significant depression. Patient denies any problems with insomnia. Patient denies excessive thirst, polyuria, polydipsia. Patient denies any swollen glands, patient denies easy bruising or easy bleeding. Patient denies any recent infections, allergies or URI. Patient "s visual fields have not changed significantly in recent time.   Nursing Notes:  Nursing Vital Signs and Chemo Nursing Nursing Notes: *CC Vital Signs Flowsheet:   10-Dec-15 09:20  Temp Temperature 95.7  Pulse Pulse 65  Respirations Respirations 18  SBP SBP 144  DBP DBP 77  Pain Scale (0-10)  0  Current Weight (kg) (kg) 103.7   Physical Exam:  General/Skin/HEENT:  General normal   Skin normal   Eyes normal   ENMT normal   Head and Neck normal   Additional PE well-developed slightly obese male in NAD. Lungs are clear to A&P cardiac examination shows regular rate and rhythm abdomen is benign. On rectal exam rectal sphincter tone is good. Prostate has multiple areas of firmness bilaterally. Sulcus appears preserved bilaterally. No other rectal abnormality is identified.   Breasts/Resp/CV/GI/GU:  Respiratory and Thorax normal   Cardiovascular normal   Gastrointestinal normal   Genitourinary normal   MS/Neuro/Psych/Lymph:  Musculoskeletal normal   Neurological normal   Lymphatics normal   Other Results:  Radiology Results: CT:    24-Nov-15 15:40, CT Abdomen and Pelvis Without Contrast  CT Abdomen and Pelvis Without Contrast   REASON FOR EXAM:    LABS FIRST Recent dx of Prostate CA - creatinine 2.5  COMMENTS:       PROCEDURE: CT  - CT ABDOMEN AND PELVIS W0  - Aug 21 2014  3:40PM     CLINICAL DATA:  Prostate cancer staging.  Initial encounter.    EXAM:  CT ABDOMEN AND PELVIS WITHOUT CONTRAST    TECHNIQUE:  Multidetector CT imaging of the abdomen and pelvis was  performed  following the standard protocol without IV contrast.    COMPARISON:  Whole-body bone scan same date. CT urogram 03/26/2005  -no report.    FINDINGS:  Lower chest: Clear lung bases. No significant pleural or pericardial  effusion.    Hepatobiliary: As evaluated in the noncontrast state, the liver  demonstrates no abnormality. No evidence of gallstones, gallbladder  wall thickening or biliary dilatation.    Pancreas: Unremarkable. No pancreatic ductal dilatation or  surrounding inflammatory changes.    Spleen: Normal in size without focal abnormality.  Adrenals/Urinary Tract: Both adrenal glands appear normal.There is a  4 mm nonobstructing calculus in the lower pole of the left kidney.  No ureteral calculus or hydronephrosis demonstrated. The bladder is  poorly distended with possible wall thickening asymmetric towards  the right. The prostate gland is enlarged and demonstrates  asymmetric protrusion into the bladder lumen on the right.    Stomach/Bowel: No evidence of bowel wall thickening, distention or  surrounding inflammatory change.There is a large right inguinal  hernia which contains the cecum and terminal ileum. The appendix is  not clearly demonstrated. No inflammatory changes are identified  within the hernia. There is no evidence of incarceration or  obstruction.    Vascular/Lymphatic: There are no enlarged abdominal or pelvic  lymph  nodes. Minimal atherosclerosis is noted.    Reproductive: As above, there is asymmetric enlargement of the right  aspect of the prostate gland which protrudes into the bladder base.    Other: Large right inguinal hernia as described above.    Musculoskeletal: No acute or significant osseous findings. No  evidence of osseous metastatic disease. Facet degenerative changes  present in the lower lumbar spine.     IMPRESSION:  1. Asymmetric enlargement of the prostate gland on the right with  protrusion into the bladder  base. Asymmetric bladder wall thickening  on the right cannot be excluded.  2. No evidence of metastatic disease or ureteral obstruction.  3. Nonobstructing calculus in the lower pole of the left kidney.  4. Large right inguinal hernia containing terminal ileum and cecum.  No evidence for incarceration or obstruction.      Electronically Signed    By: Camie Patience M.D.    On: 08/22/2014 08:21         Verified By: Vivia Ewing, M.D.,  Nuclear Med:    [phone] 13:45, Bone Scan Whole Body (Part 2 of 2)  Bone Scan Whole Body (Part 2 of 2)   REASON FOR EXAM:    Recent dx of Prostate CA  COMMENTS:       PROCEDURE: NM  - NM BONE WB 3 HR 2 OF 2  - Aug 21 2014  1:45PM     CLINICAL DATA:  Recent diagnosis of prostate cancer. Initial  encounter.    EXAM:  NUCLEAR MEDICINE WHOLE BODY BONE SCAN    TECHNIQUE:  Whole body anterior and posterior images were obtained approximately  3 hours after intravenous injection of radiopharmaceutical.  RADIOPHARMACEUTICALS:  23.151 mCi Technetium-99 MDP    COMPARISON:  Abdominal pelvic CT 03/26/2005. Patient is scheduled  for follow-up CT today, not yet performed.    FINDINGS:  There is no osseous activity suspicious for metastatic disease.  Scattered mild degenerative activity is present within the spine.  There is also mild degenerative activity at both acromioclavicular  and sternoclavicular joints. There are mild degenerative changes in  the knees and right first metatarsal phalangeal joint.    Asymmetric soft tissue activity in the left perineal region is  likely due to urine contamination. The perineal soft tissues are  prominent and there may be a hernia in this region. The soft tissue  activity otherwise appears normal.     IMPRESSION:  No evidence of osseous metastatic disease. Scattered degenerative  changes as described.      Electronically Signed    By: Camie Patience M.D.    On: 08/21/2014 15:42         Verified By:  Vivia Ewing, M.D.,   Relevent Results:   Relevant Scans and Labs bone scan and CT scans are reviewed   Assessment and Plan: Impression:   stage IIB adenocarcinoma the prostate in 64 year old male with Gleason score of 7 (4+3) and a PSA of 25 Plan:   at this point according to the [facility] nomogram patient will have a 16% chance of progression free probability after radical prostatectomy. He only has an 11% chance of organ confined disease and a 40% chance of lymph node involvement as well as seminal vesicle invasion. Based on information I believe he would be a good candidate for image guided radiation therapy to both his prostate and pelvic nodes. I would plan on delivering 8000 cGy to his prostate and deliver 5400 cGy  to his pelvic nodes using IM RT dose painting technique. Risks and benefits of treatment including diarrhea dysuria alteration of blood counts fatigue increase in urinary symptoms all were discussed in detail with the patient. I have asked Dr. Erlene Quan plays gold fiduciary markers in the prostate for daily image guided treatment. I have set him up and ordered CT simulation shortly thereafter placement of the markers. I discussed the case personally with Dr. Mellissa Kohut C we'll start the patient on androgen deprivation therapy. Would probably like him suppressed for 18 months.  I would like to take this opportunity for allowing me to participate in the care of your patient..  Fax to Physician:  Physicians To Recieve Fax: Sherlynn Stalls, MD - [phone] [name], MD - [id number].  Electronic Signatures: Nansi Birmingham, Roda Shutters (MD)  (Signed 10-Dec-15 11:19)  Authored: HPI, Diagnosis, Past Hx, PFSH, Home Meds, ROS, Nursing Notes, Physical Exam, Other Results, Relevent Results, Encounter Assessment and Plan, Fax to Physician   Last Updated: 10-Dec-15 11:19 by Armstead Peaks (MD)

## 2015-01-21 DIAGNOSIS — C61 Malignant neoplasm of prostate: Secondary | ICD-10-CM | POA: Diagnosis not present

## 2015-01-21 DIAGNOSIS — Z79818 Long term (current) use of other agents affecting estrogen receptors and estrogen levels: Secondary | ICD-10-CM | POA: Diagnosis not present

## 2015-01-21 LAB — COMPREHENSIVE METABOLIC PANEL
ALK PHOS: 41 U/L
ALT: 23 U/L
ANION GAP: 9 (ref 7–16)
Albumin: 3.9 g/dL
BILIRUBIN TOTAL: 3 mg/dL — AB
BUN: 39 mg/dL — AB
CHLORIDE: 108 mmol/L
CREATININE: 2.13 mg/dL — AB
Calcium, Total: 11 mg/dL — ABNORMAL HIGH
Co2: 22 mmol/L
EGFR (African American): 37 — ABNORMAL LOW
EGFR (Non-African Amer.): 32 — ABNORMAL LOW
Glucose: 110 mg/dL — ABNORMAL HIGH
POTASSIUM: 4.1 mmol/L
SGOT(AST): 22 U/L
Sodium: 139 mmol/L
TOTAL PROTEIN: 7.9 g/dL

## 2015-01-21 LAB — CBC CANCER CENTER
BASOS PCT: 1 %
Basophil #: 0 x10 3/mm (ref 0.0–0.1)
EOS PCT: 4.1 %
Eosinophil #: 0.2 x10 3/mm (ref 0.0–0.7)
HCT: 37.3 % — ABNORMAL LOW (ref 40.0–52.0)
HGB: 11.5 g/dL — AB (ref 13.0–18.0)
LYMPHS ABS: 0.7 x10 3/mm — AB (ref 1.0–3.6)
Lymphocyte %: 14.3 %
MCH: 21.5 pg — AB (ref 26.0–34.0)
MCHC: 30.8 g/dL — ABNORMAL LOW (ref 32.0–36.0)
MCV: 70 fL — AB (ref 80–100)
MONO ABS: 0.6 x10 3/mm (ref 0.2–1.0)
Monocyte %: 13 %
Neutrophil #: 3.1 x10 3/mm (ref 1.4–6.5)
Neutrophil %: 67.6 %
Platelet: 186 x10 3/mm (ref 150–440)
RBC: 5.34 10*6/uL (ref 4.40–5.90)
RDW: 17.8 % — ABNORMAL HIGH (ref 11.5–14.5)
WBC: 4.5 x10 3/mm (ref 3.8–10.6)

## 2015-01-22 LAB — PSA: PSA: 1.1 ng/mL (ref 0.0–4.0)

## 2015-01-28 ENCOUNTER — Other Ambulatory Visit: Payer: Self-pay | Admitting: *Deleted

## 2015-01-28 DIAGNOSIS — C61 Malignant neoplasm of prostate: Secondary | ICD-10-CM

## 2015-02-04 ENCOUNTER — Inpatient Hospital Stay: Payer: Medicare Other | Attending: Oncology

## 2015-02-04 ENCOUNTER — Other Ambulatory Visit: Payer: Self-pay

## 2015-02-04 DIAGNOSIS — C61 Malignant neoplasm of prostate: Secondary | ICD-10-CM | POA: Diagnosis not present

## 2015-02-04 DIAGNOSIS — Z79818 Long term (current) use of other agents affecting estrogen receptors and estrogen levels: Secondary | ICD-10-CM | POA: Diagnosis not present

## 2015-02-04 LAB — HEPATIC FUNCTION PANEL
ALT: 24 U/L (ref 17–63)
AST: 22 U/L (ref 15–41)
Albumin: 4 g/dL (ref 3.5–5.0)
Alkaline Phosphatase: 37 U/L — ABNORMAL LOW (ref 38–126)
BILIRUBIN DIRECT: 1.8 mg/dL — AB (ref 0.1–0.5)
BILIRUBIN INDIRECT: 1.3 mg/dL — AB (ref 0.3–0.9)
TOTAL PROTEIN: 7.3 g/dL (ref 6.5–8.1)
Total Bilirubin: 3.1 mg/dL — ABNORMAL HIGH (ref 0.3–1.2)

## 2015-02-22 DIAGNOSIS — Z87442 Personal history of urinary calculi: Secondary | ICD-10-CM | POA: Diagnosis not present

## 2015-02-22 DIAGNOSIS — C61 Malignant neoplasm of prostate: Secondary | ICD-10-CM | POA: Diagnosis not present

## 2015-04-19 ENCOUNTER — Other Ambulatory Visit: Payer: Self-pay | Admitting: *Deleted

## 2015-04-19 DIAGNOSIS — C61 Malignant neoplasm of prostate: Secondary | ICD-10-CM

## 2015-04-22 ENCOUNTER — Inpatient Hospital Stay: Payer: Medicare Other

## 2015-04-22 ENCOUNTER — Encounter: Payer: Self-pay | Admitting: Oncology

## 2015-04-22 ENCOUNTER — Inpatient Hospital Stay: Payer: Medicare Other | Attending: Oncology | Admitting: Oncology

## 2015-04-22 VITALS — BP 142/35 | HR 67 | Temp 96.2°F | Resp 18 | Wt 231.0 lb

## 2015-04-22 DIAGNOSIS — I10 Essential (primary) hypertension: Secondary | ICD-10-CM | POA: Diagnosis not present

## 2015-04-22 DIAGNOSIS — C61 Malignant neoplasm of prostate: Secondary | ICD-10-CM | POA: Diagnosis not present

## 2015-04-22 DIAGNOSIS — E559 Vitamin D deficiency, unspecified: Secondary | ICD-10-CM | POA: Diagnosis not present

## 2015-04-22 DIAGNOSIS — E213 Hyperparathyroidism, unspecified: Secondary | ICD-10-CM | POA: Diagnosis not present

## 2015-04-22 DIAGNOSIS — Z923 Personal history of irradiation: Secondary | ICD-10-CM | POA: Diagnosis not present

## 2015-04-22 DIAGNOSIS — Z79899 Other long term (current) drug therapy: Secondary | ICD-10-CM | POA: Insufficient documentation

## 2015-04-22 DIAGNOSIS — E785 Hyperlipidemia, unspecified: Secondary | ICD-10-CM | POA: Insufficient documentation

## 2015-04-22 DIAGNOSIS — Z87891 Personal history of nicotine dependence: Secondary | ICD-10-CM | POA: Diagnosis not present

## 2015-04-22 DIAGNOSIS — Z79818 Long term (current) use of other agents affecting estrogen receptors and estrogen levels: Secondary | ICD-10-CM | POA: Insufficient documentation

## 2015-04-22 DIAGNOSIS — H548 Legal blindness, as defined in USA: Secondary | ICD-10-CM

## 2015-04-22 HISTORY — DX: Malignant neoplasm of prostate: C61

## 2015-04-22 LAB — COMPREHENSIVE METABOLIC PANEL
ALBUMIN: 4.1 g/dL (ref 3.5–5.0)
ALK PHOS: 39 U/L (ref 38–126)
ALT: 25 U/L (ref 17–63)
AST: 25 U/L (ref 15–41)
Anion gap: 7 (ref 5–15)
BILIRUBIN TOTAL: 2.1 mg/dL — AB (ref 0.3–1.2)
BUN: 47 mg/dL — ABNORMAL HIGH (ref 6–20)
CO2: 21 mmol/L — ABNORMAL LOW (ref 22–32)
CREATININE: 2.22 mg/dL — AB (ref 0.61–1.24)
Calcium: 10 mg/dL (ref 8.9–10.3)
Chloride: 111 mmol/L (ref 101–111)
GFR calc Af Amer: 34 mL/min — ABNORMAL LOW (ref >60)
GFR, EST NON AFRICAN AMERICAN: 29 mL/min — AB (ref >60)
Glucose, Bld: 109 mg/dL — ABNORMAL HIGH (ref 65–99)
Potassium: 4.1 mmol/L (ref 3.5–5.1)
Sodium: 139 mmol/L (ref 135–145)
Total Protein: 7.5 g/dL (ref 6.5–8.1)

## 2015-04-22 LAB — CBC WITH DIFFERENTIAL/PLATELET
BASOS PCT: 1 %
Basophils Absolute: 0 10*3/uL (ref 0–0.1)
EOS ABS: 0.3 10*3/uL (ref 0–0.7)
EOS PCT: 7 %
HCT: 36.9 % — ABNORMAL LOW (ref 40.0–52.0)
HEMOGLOBIN: 11.3 g/dL — AB (ref 13.0–18.0)
Lymphocytes Relative: 16 %
Lymphs Abs: 0.6 10*3/uL — ABNORMAL LOW (ref 1.0–3.6)
MCH: 21.5 pg — ABNORMAL LOW (ref 26.0–34.0)
MCHC: 30.7 g/dL — ABNORMAL LOW (ref 32.0–36.0)
MCV: 70 fL — AB (ref 80.0–100.0)
MONO ABS: 0.4 10*3/uL (ref 0.2–1.0)
MONOS PCT: 10 %
Neutro Abs: 2.4 10*3/uL (ref 1.4–6.5)
Neutrophils Relative %: 66 %
Platelets: 188 10*3/uL (ref 150–440)
RBC: 5.27 MIL/uL (ref 4.40–5.90)
RDW: 15.4 % — ABNORMAL HIGH (ref 11.5–14.5)
WBC: 3.6 10*3/uL — AB (ref 3.8–10.6)

## 2015-04-22 LAB — PSA: PSA: 0.13 ng/mL (ref 0.00–4.00)

## 2015-04-22 MED ORDER — LEUPROLIDE ACETATE (3 MONTH) 22.5 MG IM KIT
22.5000 mg | PACK | Freq: Once | INTRAMUSCULAR | Status: AC
  Administered 2015-04-22: 22.5 mg via INTRAMUSCULAR
  Filled 2015-04-22: qty 22.5

## 2015-04-22 NOTE — Progress Notes (Signed)
Patient does not have living will.  Materials given.  Formerly smoked cigars.

## 2015-04-22 NOTE — Progress Notes (Signed)
[place] @ [facility] Telephone:([phone]) [phone]  Fax:([phone]) [place] OB: 1951/08/21  MR#: [id number]  MGQ#:[id number]  Patient Care Team: [name], PA-C as PCP - General (Physician Assistant)  CHIEF COMPLAINT:  Chief Complaint  Patient presents with  . 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 Patient has all 12 out of 12 or positive for malignancy .  According to NCCN guidelines patient is in high risk for recurrent disease locally as well as distant metastases patient had a bone scan and a CT scan of abdomen pelvis without any evidence of metastases (November, 2015) 2.started on   Firmagon December of 2015 Radiation to be started in January, 2016 patient has finished radiationtherapy(March, 2016     Malignant neoplasm of prostate   04/22/2015 Initial Diagnosis Malignant neoplasm of prostate    Oncology Flowsheet 04/22/2015  leuprolide (LUPRON) IM 25.32 mg   64 year old gentleman with locally advanced carcinoma prostate INTERVAL HISTORY:  Patient came today further follow-up no difficulty passing urine.  No bony pain.  Appetite has been stable.  No nausea.  No vomiting.  No diarrhea.  Getting Lupron on a regular basis.  Last PSA was 1.1 REVIEW OF SYSTEMS:   GENERAL:  Feels good.  Active.  No fevers, sweats or weight loss. PERFORMANCE STATUS (ECOG): 01 HEENT:  No visual changes, runny nose, sore throat, mouth sores or tenderness. Lungs: No shortness of breath or cough.  No hemoptysis. Cardiac:  No chest pain, palpitations, orthopnea, or PND. GI:  No nausea, vomiting, diarrhea, constipation, melena or hematochezia. GU:  No urgency, frequency, dysuria, or hematuria. Musculoskeletal:  No back pain.  No joint pain.  No muscle tenderness. Extremities:  No pain or swelling. Skin:  No rashes or skin changes. Neuro:  No headache, numbness or  weakness, balance or coordination issues. Endocrine:  No diabetes, thyroid issues, hot flashes or night sweats. Psych:  No mood changes, depression or anxiety. Pain:  No focal pain. Review of systems:  All other systems reviewed and found to be negative. As per HPI. Otherwise, a complete review of systems is negatve.  PAST MEDICAL HISTORY: Past Medical History  Diagnosis Date  . Malignant neoplasm of prostate 04/22/2015    Has patient had any of the following test? Colonscopy  Prostate Exam (1)   Last Colonoscopy: 2015(1)   Last Prostate Exam: 2015(1)   Smoking History: Smoking History Cigars only - Quit 25 yrs ago(1)  PFSH: Social History: negative alcohol, negative tobacco  Additional Past Medical and Surgical History: history of kidney stones..  Atypical lymphocytosis..  Hypertension..  Hyperlipidemia.  Vitamin D deficiency.  Hyperparathyroidism.  Patient is legally blind   ADVANCED DIRECTIVES:  No flowsheet data found.  HEALTH MAINTENANCE: History  Substance Use Topics  . Smoking status: Former Research scientist (life sciences)  . Smokeless tobacco: Not on file  . Alcohol Use: Not on file      Not on File  Current Outpatient Prescriptions  Medication Sig Dispense Refill  . bimatoprost (LUMIGAN) 0.03 % ophthalmic solution     . Brinzolamide-Brimonidine (SIMBRINZA) 1-0.2 % SUSP Apply to eye.    . furosemide (LASIX) 40 MG tablet Take 40 mg by mouth.    . hydrochlorothiazide (HYDRODIURIL) 25 MG tablet Take 25 mg by mouth.    . losartan (COZAAR) 100 MG tablet     . metoprolol succinate (TOPROL-XL) 50  MG 24 hr tablet     . pravastatin (PRAVACHOL) 40 MG tablet     . timolol (TIMOPTIC) 0.25 % ophthalmic solution Apply to eye.     No current facility-administered medications for this visit.    OBJECTIVE:  Filed Vitals:   04/22/15 1133  BP: 142/35  Pulse: 67  Temp: 96.2 F (35.7 C)  Resp: 18     There is no height on file to calculate BMI.    ECOG FS:1 - Symptomatic but completely  ambulatory  PHYSICAL EXAM: GENERAL:  Well developed, well nourished, sitting comfortably in the exam room in no acute distress. MENTAL STATUS:  Alert and oriented to person, place and time. HEAD:  .  Normocephalic, atraumatic, face symmetric, no Cushingoid features. EYES:  .  Pupils equal round and reactive to light and accomodation.  No conjunctivitis or scleral icterus. ENT:  Oropharynx clear without lesion.  Tongue normal. Mucous membranes moist.  RESPIRATORY:  Clear to auscultation without rales, wheezes or rhonchi. CARDIOVASCULAR:  Regular rate and rhythm without murmur, rub or gallop.  ABDOMEN:  Soft, non-tender, with active bowel sounds, and no hepatosplenomegaly.  No masses. BACK:  No CVA tenderness.  No tenderness on percussion of the back or rib cage. SKIN:  No rashes, ulcers or lesions. EXTREMITIES: No edema, no skin discoloration or tenderness.  No palpable cords. LYMPH NODES: No palpable cervical, supraclavicular, axillary or inguinal adenopathy  NEUROLOGICAL: Unremarkable. PSYCH:  Appropriate.   LAB RESULTS:  Appointment on 04/22/2015  Component Date Value Ref Range Status  . WBC 04/22/2015 3.6* 3.8 - 10.6 K/uL Final  . RBC 04/22/2015 5.27  4.40 - 5.90 MIL/uL Final  . Hemoglobin 04/22/2015 11.3* 13.0 - 18.0 g/dL Final  . HCT 04/22/2015 36.9* 40.0 - 52.0 % Final  . MCV 04/22/2015 70.0* 80.0 - 100.0 fL Final  . MCH 04/22/2015 21.5* 26.0 - 34.0 pg Final  . MCHC 04/22/2015 30.7* 32.0 - 36.0 g/dL Final  . RDW 04/22/2015 15.4* 11.5 - 14.5 % Final  . Platelets 04/22/2015 188  150 - 440 K/uL Final  . Neutrophils Relative % 04/22/2015 66   Final  . Neutro Abs 04/22/2015 2.4  1.4 - 6.5 K/uL Final  . Lymphocytes Relative 04/22/2015 16   Final  . Lymphs Abs 04/22/2015 0.6* 1.0 - 3.6 K/uL Final  . Monocytes Relative 04/22/2015 10   Final  . Monocytes Absolute 04/22/2015 0.4  0.2 - 1.0 K/uL Final  . Eosinophils Relative 04/22/2015 7   Final  . Eosinophils Absolute 04/22/2015  0.3  0 - 0.7 K/uL Final  . Basophils Relative 04/22/2015 1   Final  . Basophils Absolute 04/22/2015 0.0  0 - 0.1 K/uL Final  . Sodium 04/22/2015 139  135 - 145 mmol/L Final  . Potassium 04/22/2015 4.1  3.5 - 5.1 mmol/L Final  . Chloride 04/22/2015 111  101 - 111 mmol/L Final  . CO2 04/22/2015 21* 22 - 32 mmol/L Final  . Glucose, Bld 04/22/2015 109* 65 - 99 mg/dL Final  . BUN 04/22/2015 47* 6 - 20 mg/dL Final  . Creatinine, Ser 04/22/2015 2.22* 0.61 - 1.24 mg/dL Final  . Calcium 04/22/2015 10.0  8.9 - 10.3 mg/dL Final  . Total Protein 04/22/2015 7.5  6.5 - 8.1 g/dL Final  . Albumin 04/22/2015 4.1  3.5 - 5.0 g/dL Final  . AST 04/22/2015 25  15 - 41 U/L Final  . ALT 04/22/2015 25  17 - 63 U/L Final  . Alkaline Phosphatase 04/22/2015 39  38 - 126 U/L Final  . Total Bilirubin 04/22/2015 2.1* 0.3 - 1.2 mg/dL Final  . GFR calc non Af Amer 04/22/2015 29* >60 mL/min Final  . GFR calc Af Amer 04/22/2015 34* >60 mL/min Final   Comment: (NOTE) The eGFR has been calculated using the CKD EPI equation. This calculation has not been validated in all clinical situations. eGFR's persistently <60 mL/min signify possible Chronic Kidney Disease.   . Anion gap 04/22/2015 7  5 - 15 Final      STUDIES: No results found.  ASSESSMENT: stage IIB adenocarcinoma the prostate in 64 year old male with Gleason score of 7 (4+3) and a PSA of 25  12out of 12:  core   Positive for malignancy According to NCCN and guidelines patient is in high risk for local as well as systemic recurrent disease. As per guideline be would recommend external beam radiation therapy and/or brachii therapy followed by androgen deprivation therapy   for 2 years . side effects  of androgen deprivation therapy has been discussed.  2.patient has a mild renal insufficiency 3.  Hypercalcemia secondary to hyperparathyroidism..  continue androgen deprivation therap PSA is declining Because of hypercalcemia Associated with  hyperparathyroidism patient would be advised to take vitamin D not calcium.  patient would have bone density study checked on a yearly basis and if needed  biphosphonate therapy can be added Bilirubin is slightly elevated but shows downward trend. Patient remains asymptomatic. Serum creatinine is high and patient will be referred to nephrologist for evaluation Because of poor compliance to androgen deprivation therapy he would be started on Lupron 22.5 mg     MEDICAL DECISION MAKING:  Continue Lupron.  Reevaluate patient in 3 months.  Bone density study would be ordered. Refer patient will nephrologist. Total duration of visit was 8mnutes.  50% or more time was spent in counseling patient and family regarding prognosis and options of treatment and available resources  Patient expressed understanding and was in agreement with this plan. He also understands that He can call clinic at any time with any questions, concerns, or complaints.    No matching staging information was found for the patient.  JForest Gleason MD   04/22/2015 2:26 PM

## 2015-05-03 DIAGNOSIS — H4011X3 Primary open-angle glaucoma, severe stage: Secondary | ICD-10-CM | POA: Diagnosis not present

## 2015-05-24 ENCOUNTER — Other Ambulatory Visit: Payer: Medicare Other

## 2015-05-24 ENCOUNTER — Inpatient Hospital Stay: Payer: Medicare Other | Attending: Radiation Oncology

## 2015-05-24 ENCOUNTER — Ambulatory Visit
Admission: RE | Admit: 2015-05-24 | Discharge: 2015-05-24 | Disposition: A | Discharge status: Home / Self Care | Payer: Medicare Other | Source: Ambulatory Visit | Attending: Radiation Oncology | Admitting: Radiation Oncology

## 2015-05-24 ENCOUNTER — Encounter: Payer: Self-pay | Admitting: Radiation Oncology

## 2015-05-24 ENCOUNTER — Other Ambulatory Visit: Payer: Self-pay | Admitting: *Deleted

## 2015-05-24 VITALS — BP 146/71 | HR 65 | Temp 95.5°F | Resp 20 | Wt 232.4 lb

## 2015-05-24 DIAGNOSIS — C61 Malignant neoplasm of prostate: Secondary | ICD-10-CM

## 2015-05-24 NOTE — Progress Notes (Signed)
Radiation Oncology Follow up Note  Name: Nathan Knapp   Date:   05/24/2015 MRN:  [id number] DOB: 07-23-1951    This 64 y.o. male presents to the clinic today for follow-up for prostate cancer.  REFERRING PROVIDER: Forest Gleason, MD  HPI: Patient is a 64 year old male now out 5 months having completed image guided radiation therapy for a Gleason 7 (4+3) adenocarcinoma presenting with a PSA of 25.4.. He received radiation therapy to his prostate as well as pelvic nodes. He is seen today in routine follow-up and is doing well specifically denies diarrhea dysuria and in any other GI/GU complaints. We are checking on his Lupron schedule since he may need another injection since we want him suppressed for approximately 18 months. Recently had a PSA which was 0.3.  COMPLICATIONS OF TREATMENT: none  FOLLOW UP COMPLIANCE: keeps appointments   PHYSICAL EXAM:  BP 146/71 mmHg  Pulse 65  Temp(Src) 95.5 F (35.3 C)  Resp 20  Wt 232 lb 5.8 oz (105.4 kg) On rectal exam rectal sphincter tone is good. Prostate is smooth contracted without evidence of nodularity or mass. Sulcus is preserved bilaterally. No discrete nodularity is identified. No other rectal abnormalities are noted. Well-developed well-nourished patient in NAD. HEENT reveals PERLA, EOMI, discs not visualized.  Oral cavity is clear. No oral mucosal lesions are identified. Neck is clear without evidence of cervical or supraclavicular adenopathy. Lungs are clear to A&P. Cardiac examination is essentially unremarkable with regular rate and rhythm without murmur rub or thrill. Abdomen is benign with no organomegaly or masses noted. Motor sensory and DTR levels are equal and symmetric in the upper and lower extremities. Cranial nerves II through XII are grossly intact. Proprioception is intact. No peripheral adenopathy or edema is identified. No motor or sensory levels are noted. Crude visual fields are within normal range.   RADIOLOGY RESULTS: No  current films to review  PLAN: At the present time he continues to do well. I like to keep him suppressed for 18 months will check on his Lupron schedule in order another Lupron shot if indicated. Otherwise I'm please was overall progress. I've asked to see him back in 6 months for follow-up. Patient is to call sooner with any concerns.  I would like to take this opportunity for allowing me to participate in the care of your patient.Armstead Peaks., MD

## 2015-07-02 DIAGNOSIS — H401133 Primary open-angle glaucoma, bilateral, severe stage: Secondary | ICD-10-CM | POA: Diagnosis not present

## 2015-07-13 IMAGING — NM NUCLEAR MEDICINE WHOLE BODY BONE SCINTIGRAPHY
2 series · 6 of 6 positions shown · non-contrast
Comparison: Abdominal pelvic CT 03/26/2005. Patient is scheduled
for follow-up CT today, not yet performed.

CLINICAL DATA: Recent diagnosis of prostate cancer. Initial
encounter.

EXAM:
NUCLEAR MEDICINE WHOLE BODY BONE SCAN
TECHNIQUE: Whole body anterior and posterior images were obtained approximately
3 hours after intravenous injection of radiopharmaceutical.
RADIOPHARMACEUTICALS:  23.151 mCi Wechnetium-DD MDP

[Series 1000: 3 hr wholebody · 2.40mm/px · 2 of 2 frames shown]
[frame 1/2]
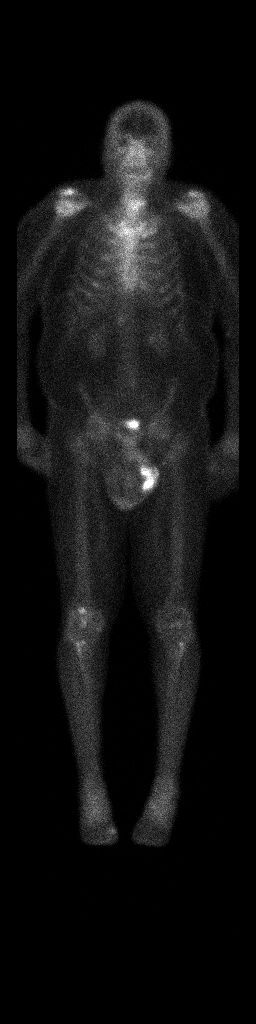
[frame 2/2]
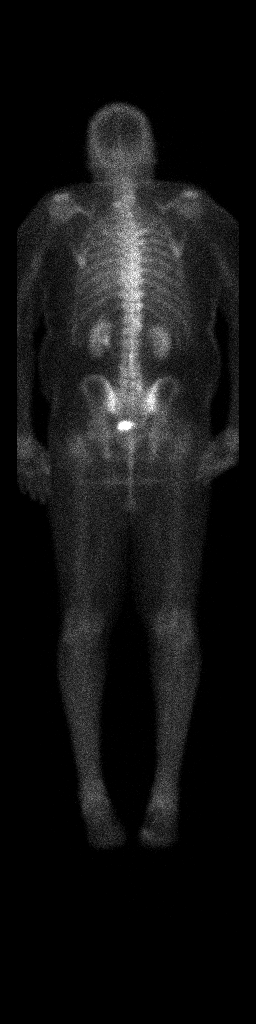

[Series 1000: statics · 2.40mm/px · 2 acquisitions, 4 frames shown]
[im 1/2]
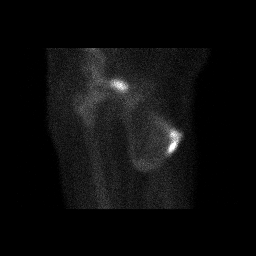
[im 1/2]
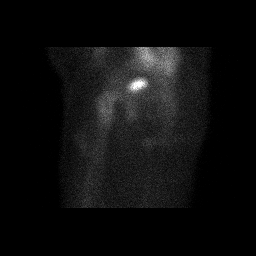
[im 2/2]
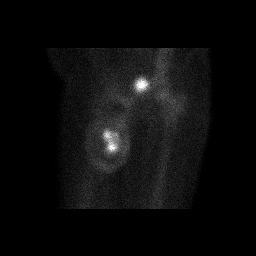
[im 2/2]
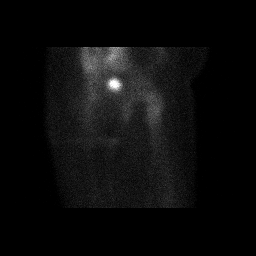

[6 of 6 positions shown; findings below may reference images not displayed]

FINDINGS: There is no osseous activity suspicious for metastatic disease.
Scattered mild degenerative activity is present within the spine.
There is also mild degenerative activity at both acromioclavicular
and sternoclavicular joints. There are mild degenerative changes in
the knees and right first metatarsal phalangeal joint.

Asymmetric soft tissue activity in the left perineal region is
likely due to urine contamination. The perineal soft tissues are
prominent and there may be a hernia in this region. The soft tissue
activity otherwise appears normal.
IMPRESSION: No evidence of osseous metastatic disease. Scattered degenerative
changes as described.

## 2015-07-16 DIAGNOSIS — Z6834 Body mass index (BMI) 34.0-34.9, adult: Secondary | ICD-10-CM | POA: Diagnosis not present

## 2015-07-16 DIAGNOSIS — C61 Malignant neoplasm of prostate: Secondary | ICD-10-CM | POA: Diagnosis not present

## 2015-07-16 DIAGNOSIS — R7303 Prediabetes: Secondary | ICD-10-CM | POA: Diagnosis not present

## 2015-07-16 DIAGNOSIS — G4700 Insomnia, unspecified: Secondary | ICD-10-CM | POA: Diagnosis not present

## 2015-07-16 DIAGNOSIS — I10 Essential (primary) hypertension: Secondary | ICD-10-CM | POA: Diagnosis not present

## 2015-07-16 DIAGNOSIS — E7800 Pure hypercholesterolemia, unspecified: Secondary | ICD-10-CM | POA: Diagnosis not present

## 2015-07-16 DIAGNOSIS — J309 Allergic rhinitis, unspecified: Secondary | ICD-10-CM | POA: Diagnosis not present

## 2015-07-16 DIAGNOSIS — E119 Type 2 diabetes mellitus without complications: Secondary | ICD-10-CM | POA: Diagnosis not present

## 2015-07-16 DIAGNOSIS — Z0000 Encounter for general adult medical examination without abnormal findings: Secondary | ICD-10-CM | POA: Diagnosis not present

## 2015-07-16 DIAGNOSIS — Z131 Encounter for screening for diabetes mellitus: Secondary | ICD-10-CM | POA: Diagnosis not present

## 2015-07-16 DIAGNOSIS — E559 Vitamin D deficiency, unspecified: Secondary | ICD-10-CM | POA: Diagnosis not present

## 2015-07-16 DIAGNOSIS — E781 Pure hyperglyceridemia: Secondary | ICD-10-CM | POA: Diagnosis not present

## 2015-08-20 ENCOUNTER — Encounter: Payer: Self-pay | Admitting: *Deleted

## 2015-08-27 ENCOUNTER — Ambulatory Visit: Payer: Self-pay | Admitting: Urology

## 2015-10-16 DIAGNOSIS — E119 Type 2 diabetes mellitus without complications: Secondary | ICD-10-CM | POA: Diagnosis not present

## 2015-10-16 DIAGNOSIS — E210 Primary hyperparathyroidism: Secondary | ICD-10-CM | POA: Diagnosis not present

## 2015-10-16 DIAGNOSIS — I10 Essential (primary) hypertension: Secondary | ICD-10-CM | POA: Diagnosis not present

## 2015-10-16 DIAGNOSIS — N183 Chronic kidney disease, stage 3 (moderate): Secondary | ICD-10-CM | POA: Diagnosis not present

## 2015-10-30 DIAGNOSIS — H401133 Primary open-angle glaucoma, bilateral, severe stage: Secondary | ICD-10-CM | POA: Diagnosis not present

## 2015-11-21 ENCOUNTER — Other Ambulatory Visit: Payer: Self-pay | Admitting: *Deleted

## 2015-11-21 ENCOUNTER — Telehealth: Payer: Self-pay | Admitting: *Deleted

## 2015-11-21 DIAGNOSIS — C61 Malignant neoplasm of prostate: Secondary | ICD-10-CM

## 2015-11-21 NOTE — Telephone Encounter (Signed)
Notified Nathan Knapp of appointment change to March 15 at 9:30.  Pt verbalized understanding with read back of appointment.

## 2015-11-27 ENCOUNTER — Inpatient Hospital Stay: Admission: RE | Admit: 2015-11-27 | Payer: Medicare Other | Source: Ambulatory Visit | Admitting: Radiation Oncology

## 2015-11-27 ENCOUNTER — Ambulatory Visit: Payer: Medicare Other | Admitting: Radiation Oncology

## 2015-12-09 ENCOUNTER — Other Ambulatory Visit: Payer: Self-pay | Admitting: *Deleted

## 2015-12-11 ENCOUNTER — Inpatient Hospital Stay: Admission: RE | Admit: 2015-12-11 | Payer: Medicare Other | Source: Ambulatory Visit | Admitting: Radiation Oncology

## 2015-12-11 ENCOUNTER — Inpatient Hospital Stay: Payer: Medicare Other | Attending: Oncology

## 2015-12-11 ENCOUNTER — Ambulatory Visit: Payer: Medicare Other | Admitting: Radiation Oncology

## 2015-12-11 ENCOUNTER — Ambulatory Visit
Admission: RE | Admit: 2015-12-11 | Discharge: 2015-12-11 | Disposition: A | Discharge status: Home / Self Care | Payer: Medicare Other | Source: Ambulatory Visit | Attending: Radiation Oncology | Admitting: Radiation Oncology

## 2015-12-11 ENCOUNTER — Encounter: Payer: Self-pay | Admitting: Radiation Oncology

## 2015-12-11 VITALS — BP 177/84 | HR 69 | Temp 96.1°F | Resp 20 | Ht 69.0 in | Wt 240.1 lb

## 2015-12-11 DIAGNOSIS — Z923 Personal history of irradiation: Secondary | ICD-10-CM | POA: Diagnosis not present

## 2015-12-11 DIAGNOSIS — C61 Malignant neoplasm of prostate: Secondary | ICD-10-CM

## 2015-12-11 DIAGNOSIS — Z79818 Long term (current) use of other agents affecting estrogen receptors and estrogen levels: Secondary | ICD-10-CM | POA: Insufficient documentation

## 2015-12-11 LAB — PSA: PSA: 0.18 ng/mL (ref 0.00–4.00)

## 2015-12-11 NOTE — Progress Notes (Signed)
Radiation Oncology Follow up Note  Name: Nathan Knapp   Date:   12/11/2015 MRN:  [id number] DOB: 1951-03-05    This 65 y.o. 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  REFERRING PROVIDER: [name], PA-C  HPI: Patient is a 65 year old male now out close to 1 year having completed IM RT radiation therapy to both his prostate as well as pelvic nodes for Gleason 7 (4+3) adenocarcinoma presenting with a PSA of 25. He is seen today in routine follow-up and is doing well. Last PSA back in July 2016 was 0.13. He specifically denies diarrhea dysuria or any other GI/GU complaints..  COMPLICATIONS OF TREATMENT: none  FOLLOW UP COMPLIANCE: keeps appointments   PHYSICAL EXAM:  BP 177/84 mmHg  Pulse 69  Temp(Src) 96.1 F (35.6 C)  Resp 20  Ht 5\' 9"  (1.753 m)  Wt 240 lb 1.3 oz (108.9 kg)  BMI 35.44 kg/m2 On rectal exam rectal sphincter tone is good. Prostate is smooth contracted without evidence of nodularity or mass. Sulcus is preserved bilaterally. No discrete nodularity is identified. No other rectal abnormalities are noted. Well-developed well-nourished patient in NAD. HEENT reveals PERLA, EOMI, discs not visualized.  Oral cavity is clear. No oral mucosal lesions are identified. Neck is clear without evidence of cervical or supraclavicular adenopathy. Lungs are clear to A&P. Cardiac examination is essentially unremarkable with regular rate and rhythm without murmur rub or thrill. Abdomen is benign with no organomegaly or masses noted. Motor sensory and DTR levels are equal and symmetric in the upper and lower extremities. Cranial nerves II through XII are grossly intact. Proprioception is intact. No peripheral adenopathy or edema is identified. No motor or sensory levels are noted. Crude visual fields are within normal range.  RADIOLOGY RESULTS: No current films for review  PLAN: At the present time he is doing  well. I have run another PSA level on him today and will report that separately. Otherwise I'm please was overall progress. Will see him back in 1 year for follow-up. Will check on his hormone suppression therapy as I believe original plan was to keep him suppressed for year and a half. Will check with urology about that. Otherwise patient is doing well with no evidence of disease.  I would like to take this opportunity for allowing me to participate in the care of your patient.Armstead Peaks., MD

## 2016-01-29 DIAGNOSIS — H401133 Primary open-angle glaucoma, bilateral, severe stage: Secondary | ICD-10-CM | POA: Diagnosis not present

## 2016-01-31 DIAGNOSIS — C61 Malignant neoplasm of prostate: Secondary | ICD-10-CM | POA: Diagnosis not present

## 2016-01-31 DIAGNOSIS — Z6836 Body mass index (BMI) 36.0-36.9, adult: Secondary | ICD-10-CM | POA: Diagnosis not present

## 2016-01-31 DIAGNOSIS — E7800 Pure hypercholesterolemia, unspecified: Secondary | ICD-10-CM | POA: Diagnosis not present

## 2016-01-31 DIAGNOSIS — E119 Type 2 diabetes mellitus without complications: Secondary | ICD-10-CM | POA: Diagnosis not present

## 2016-01-31 DIAGNOSIS — I10 Essential (primary) hypertension: Secondary | ICD-10-CM | POA: Diagnosis not present

## 2016-01-31 DIAGNOSIS — R269 Unspecified abnormalities of gait and mobility: Secondary | ICD-10-CM | POA: Diagnosis not present

## 2016-01-31 DIAGNOSIS — G4700 Insomnia, unspecified: Secondary | ICD-10-CM | POA: Diagnosis not present

## 2016-02-28 DIAGNOSIS — D509 Iron deficiency anemia, unspecified: Secondary | ICD-10-CM | POA: Diagnosis not present

## 2016-05-13 DIAGNOSIS — H401133 Primary open-angle glaucoma, bilateral, severe stage: Secondary | ICD-10-CM | POA: Diagnosis not present

## 2016-08-10 DIAGNOSIS — E669 Obesity, unspecified: Secondary | ICD-10-CM | POA: Diagnosis not present

## 2016-08-10 DIAGNOSIS — H409 Unspecified glaucoma: Secondary | ICD-10-CM | POA: Diagnosis not present

## 2016-08-10 DIAGNOSIS — I10 Essential (primary) hypertension: Secondary | ICD-10-CM | POA: Diagnosis not present

## 2016-08-10 DIAGNOSIS — Z1211 Encounter for screening for malignant neoplasm of colon: Secondary | ICD-10-CM | POA: Diagnosis not present

## 2016-08-10 DIAGNOSIS — E781 Pure hyperglyceridemia: Secondary | ICD-10-CM | POA: Diagnosis not present

## 2016-08-10 DIAGNOSIS — Z6836 Body mass index (BMI) 36.0-36.9, adult: Secondary | ICD-10-CM | POA: Diagnosis not present

## 2016-08-10 DIAGNOSIS — E119 Type 2 diabetes mellitus without complications: Secondary | ICD-10-CM | POA: Diagnosis not present

## 2016-08-10 DIAGNOSIS — Z0000 Encounter for general adult medical examination without abnormal findings: Secondary | ICD-10-CM | POA: Diagnosis not present

## 2016-08-10 DIAGNOSIS — G4700 Insomnia, unspecified: Secondary | ICD-10-CM | POA: Diagnosis not present

## 2016-08-10 DIAGNOSIS — D649 Anemia, unspecified: Secondary | ICD-10-CM | POA: Diagnosis not present

## 2016-08-10 DIAGNOSIS — E559 Vitamin D deficiency, unspecified: Secondary | ICD-10-CM | POA: Diagnosis not present

## 2016-08-10 DIAGNOSIS — H401133 Primary open-angle glaucoma, bilateral, severe stage: Secondary | ICD-10-CM | POA: Diagnosis not present

## 2016-08-10 DIAGNOSIS — C61 Malignant neoplasm of prostate: Secondary | ICD-10-CM | POA: Diagnosis not present

## 2016-08-18 DIAGNOSIS — H401133 Primary open-angle glaucoma, bilateral, severe stage: Secondary | ICD-10-CM | POA: Diagnosis not present

## 2016-08-24 DIAGNOSIS — Z1211 Encounter for screening for malignant neoplasm of colon: Secondary | ICD-10-CM | POA: Diagnosis not present

## 2016-08-24 DIAGNOSIS — Z1212 Encounter for screening for malignant neoplasm of rectum: Secondary | ICD-10-CM | POA: Diagnosis not present

## 2016-11-17 DIAGNOSIS — H401133 Primary open-angle glaucoma, bilateral, severe stage: Secondary | ICD-10-CM | POA: Diagnosis not present

## 2016-12-08 ENCOUNTER — Other Ambulatory Visit: Payer: Self-pay | Admitting: *Deleted

## 2016-12-08 DIAGNOSIS — C61 Malignant neoplasm of prostate: Secondary | ICD-10-CM

## 2016-12-09 ENCOUNTER — Inpatient Hospital Stay: Payer: Medicare HMO | Attending: Radiation Oncology

## 2016-12-09 ENCOUNTER — Ambulatory Visit
Admission: RE | Admit: 2016-12-09 | Discharge: 2016-12-09 | Disposition: A | Discharge status: Home / Self Care | Payer: Medicare HMO | Source: Ambulatory Visit | Attending: Radiation Oncology | Admitting: Radiation Oncology

## 2016-12-09 ENCOUNTER — Encounter: Payer: Self-pay | Admitting: Radiation Oncology

## 2016-12-09 VITALS — BP 195/91 | HR 75 | Temp 96.4°F | Resp 20 | Wt 239.0 lb

## 2016-12-09 DIAGNOSIS — Z923 Personal history of irradiation: Secondary | ICD-10-CM | POA: Insufficient documentation

## 2016-12-09 DIAGNOSIS — C61 Malignant neoplasm of prostate: Secondary | ICD-10-CM | POA: Diagnosis not present

## 2016-12-09 DIAGNOSIS — Z79818 Long term (current) use of other agents affecting estrogen receptors and estrogen levels: Secondary | ICD-10-CM | POA: Insufficient documentation

## 2016-12-09 LAB — PSA: PSA: 0.2 ng/mL (ref 0.00–4.00)

## 2016-12-09 NOTE — Progress Notes (Signed)
Radiation Oncology Follow up Note  Name: Nathan Knapp   Date:   12/09/2016 MRN:  [id number] DOB: Sep 01, 1951    This 66 y.o. male presents to the clinic today for 2 year follow-up for stage II prostate cancer Gleason score of 7.  REFERRING PROVIDER: [name], PA-C  HPI: Patient is a 66 year old male now out 2 years having completed I MRT radiation therapy to his prostate and pelvic nodes for Gleason 7 (4+3) adenocarcinoma presenting the PSA of 25. He is seen today in routine follow-up and is doing well. Specifically denies diarrhea dysuria or any other lower urinary tract symptoms. His last PSA 1 year prior was 0.18.[name]  COMPLICATIONS OF TREATMENT: none  FOLLOW UP COMPLIANCE: keeps appointments   PHYSICAL EXAM:  BP (!) 195/91   Pulse 75   Temp (!) 96.4 F (35.8 C)   Resp 20   Wt 238 lb 15.7 oz (108.4 kg)   BMI 35.29 kg/m  On rectal exam rectal sphincter tone is good. Prostate is smooth contracted without evidence of nodularity or mass. Sulcus is preserved bilaterally. No discrete nodularity is identified. No other rectal abnormalities are noted. Well-developed well-nourished patient in NAD. HEENT reveals PERLA, EOMI, discs not visualized.  Oral cavity is clear. No oral mucosal lesions are identified. Neck is clear without evidence of cervical or supraclavicular adenopathy. Lungs are clear to A&P. Cardiac examination is essentially unremarkable with regular rate and rhythm without murmur rub or thrill. Abdomen is benign with no organomegaly or masses noted. Motor sensory and DTR levels are equal and symmetric in the upper and lower extremities. Cranial nerves II through XII are grossly intact. Proprioception is intact. No peripheral adenopathy or edema is identified. No motor or sensory levels are noted. Crude visual fields are within normal range.  RADIOLOGY RESULTS: No current films for review  PLAN: Present time he is doing well I've run another PSA level today and will report that  separately. Otherwise I'm please was overall progress. I've asked to see him back in 1 year for follow-up. Patient knows to call sooner with any concerns.  I would like to take this opportunity to thank you for allowing me to participate in the care of your patient.Armstead Peaks., MD

## 2017-02-10 DIAGNOSIS — D563 Thalassemia minor: Secondary | ICD-10-CM | POA: Diagnosis not present

## 2017-02-10 DIAGNOSIS — E7800 Pure hypercholesterolemia, unspecified: Secondary | ICD-10-CM | POA: Diagnosis not present

## 2017-02-10 DIAGNOSIS — G4700 Insomnia, unspecified: Secondary | ICD-10-CM | POA: Diagnosis not present

## 2017-02-10 DIAGNOSIS — Z6836 Body mass index (BMI) 36.0-36.9, adult: Secondary | ICD-10-CM | POA: Diagnosis not present

## 2017-02-10 DIAGNOSIS — I10 Essential (primary) hypertension: Secondary | ICD-10-CM | POA: Diagnosis not present

## 2017-02-10 DIAGNOSIS — E119 Type 2 diabetes mellitus without complications: Secondary | ICD-10-CM | POA: Diagnosis not present

## 2017-03-10 DIAGNOSIS — H401133 Primary open-angle glaucoma, bilateral, severe stage: Secondary | ICD-10-CM | POA: Diagnosis not present

## 2017-03-15 DIAGNOSIS — Z139 Encounter for screening, unspecified: Secondary | ICD-10-CM | POA: Diagnosis not present

## 2017-03-15 DIAGNOSIS — Z6836 Body mass index (BMI) 36.0-36.9, adult: Secondary | ICD-10-CM | POA: Diagnosis not present

## 2017-03-15 DIAGNOSIS — I10 Essential (primary) hypertension: Secondary | ICD-10-CM | POA: Diagnosis not present

## 2017-03-15 DIAGNOSIS — N183 Chronic kidney disease, stage 3 (moderate): Secondary | ICD-10-CM | POA: Diagnosis not present

## 2017-04-21 DIAGNOSIS — N184 Chronic kidney disease, stage 4 (severe): Secondary | ICD-10-CM | POA: Diagnosis not present

## 2017-04-21 DIAGNOSIS — I10 Essential (primary) hypertension: Secondary | ICD-10-CM | POA: Diagnosis not present

## 2017-04-21 DIAGNOSIS — R809 Proteinuria, unspecified: Secondary | ICD-10-CM | POA: Diagnosis not present

## 2017-04-27 ENCOUNTER — Other Ambulatory Visit: Payer: Self-pay | Admitting: Nephrology

## 2017-04-27 DIAGNOSIS — N184 Chronic kidney disease, stage 4 (severe): Secondary | ICD-10-CM

## 2017-05-03 ENCOUNTER — Ambulatory Visit
Admission: RE | Admit: 2017-05-03 | Discharge: 2017-05-03 | Disposition: A | Discharge status: Home / Self Care | Payer: Medicare HMO | Source: Ambulatory Visit | Attending: Nephrology | Admitting: Nephrology

## 2017-05-03 ENCOUNTER — Ambulatory Visit: Payer: Medicare HMO

## 2017-05-03 DIAGNOSIS — N281 Cyst of kidney, acquired: Secondary | ICD-10-CM | POA: Diagnosis not present

## 2017-05-03 DIAGNOSIS — N184 Chronic kidney disease, stage 4 (severe): Secondary | ICD-10-CM | POA: Diagnosis not present

## 2017-05-03 DIAGNOSIS — N200 Calculus of kidney: Secondary | ICD-10-CM | POA: Insufficient documentation

## 2017-05-10 DIAGNOSIS — N200 Calculus of kidney: Secondary | ICD-10-CM | POA: Diagnosis not present

## 2017-05-10 DIAGNOSIS — I10 Essential (primary) hypertension: Secondary | ICD-10-CM | POA: Diagnosis not present

## 2017-05-10 DIAGNOSIS — R809 Proteinuria, unspecified: Secondary | ICD-10-CM | POA: Diagnosis not present

## 2017-05-10 DIAGNOSIS — N184 Chronic kidney disease, stage 4 (severe): Secondary | ICD-10-CM | POA: Diagnosis not present

## 2017-06-04 DIAGNOSIS — E119 Type 2 diabetes mellitus without complications: Secondary | ICD-10-CM | POA: Diagnosis not present

## 2017-06-04 DIAGNOSIS — M79602 Pain in left arm: Secondary | ICD-10-CM | POA: Diagnosis not present

## 2017-06-04 DIAGNOSIS — R0789 Other chest pain: Secondary | ICD-10-CM | POA: Diagnosis not present

## 2017-06-04 DIAGNOSIS — G4700 Insomnia, unspecified: Secondary | ICD-10-CM | POA: Diagnosis not present

## 2017-06-04 DIAGNOSIS — Z6835 Body mass index (BMI) 35.0-35.9, adult: Secondary | ICD-10-CM | POA: Diagnosis not present

## 2017-06-04 DIAGNOSIS — I10 Essential (primary) hypertension: Secondary | ICD-10-CM | POA: Diagnosis not present

## 2017-06-14 DIAGNOSIS — N200 Calculus of kidney: Secondary | ICD-10-CM | POA: Diagnosis not present

## 2017-06-17 DIAGNOSIS — I10 Essential (primary) hypertension: Secondary | ICD-10-CM | POA: Diagnosis not present

## 2017-06-17 DIAGNOSIS — I6523 Occlusion and stenosis of bilateral carotid arteries: Secondary | ICD-10-CM | POA: Diagnosis not present

## 2017-06-17 DIAGNOSIS — E782 Mixed hyperlipidemia: Secondary | ICD-10-CM | POA: Diagnosis not present

## 2017-06-17 DIAGNOSIS — I208 Other forms of angina pectoris: Secondary | ICD-10-CM | POA: Diagnosis not present

## 2017-07-06 DIAGNOSIS — I208 Other forms of angina pectoris: Secondary | ICD-10-CM | POA: Diagnosis not present

## 2017-07-09 DIAGNOSIS — I208 Other forms of angina pectoris: Secondary | ICD-10-CM | POA: Diagnosis not present

## 2017-07-09 DIAGNOSIS — I6523 Occlusion and stenosis of bilateral carotid arteries: Secondary | ICD-10-CM | POA: Diagnosis not present

## 2017-07-16 DIAGNOSIS — H401133 Primary open-angle glaucoma, bilateral, severe stage: Secondary | ICD-10-CM | POA: Diagnosis not present

## 2017-07-21 DIAGNOSIS — H401133 Primary open-angle glaucoma, bilateral, severe stage: Secondary | ICD-10-CM | POA: Diagnosis not present

## 2017-07-26 DIAGNOSIS — E7800 Pure hypercholesterolemia, unspecified: Secondary | ICD-10-CM | POA: Diagnosis not present

## 2017-07-26 DIAGNOSIS — Z79899 Other long term (current) drug therapy: Secondary | ICD-10-CM | POA: Diagnosis not present

## 2017-07-26 DIAGNOSIS — Z6836 Body mass index (BMI) 36.0-36.9, adult: Secondary | ICD-10-CM | POA: Diagnosis not present

## 2017-07-26 DIAGNOSIS — I10 Essential (primary) hypertension: Secondary | ICD-10-CM | POA: Diagnosis not present

## 2017-07-26 DIAGNOSIS — Z23 Encounter for immunization: Secondary | ICD-10-CM | POA: Diagnosis not present

## 2017-07-26 DIAGNOSIS — E210 Primary hyperparathyroidism: Secondary | ICD-10-CM | POA: Diagnosis not present

## 2017-07-26 DIAGNOSIS — N184 Chronic kidney disease, stage 4 (severe): Secondary | ICD-10-CM | POA: Diagnosis not present

## 2017-07-26 DIAGNOSIS — E119 Type 2 diabetes mellitus without complications: Secondary | ICD-10-CM | POA: Diagnosis not present

## 2017-07-27 DIAGNOSIS — I10 Essential (primary) hypertension: Secondary | ICD-10-CM | POA: Diagnosis not present

## 2017-07-27 DIAGNOSIS — I6523 Occlusion and stenosis of bilateral carotid arteries: Secondary | ICD-10-CM | POA: Diagnosis not present

## 2017-07-27 DIAGNOSIS — E782 Mixed hyperlipidemia: Secondary | ICD-10-CM | POA: Diagnosis not present

## 2017-07-29 DIAGNOSIS — N200 Calculus of kidney: Secondary | ICD-10-CM | POA: Diagnosis not present

## 2017-07-29 DIAGNOSIS — K4090 Unilateral inguinal hernia, without obstruction or gangrene, not specified as recurrent: Secondary | ICD-10-CM | POA: Diagnosis not present

## 2017-08-04 DIAGNOSIS — H401133 Primary open-angle glaucoma, bilateral, severe stage: Secondary | ICD-10-CM | POA: Diagnosis not present

## 2017-10-08 DIAGNOSIS — N184 Chronic kidney disease, stage 4 (severe): Secondary | ICD-10-CM | POA: Diagnosis not present

## 2017-10-08 DIAGNOSIS — I10 Essential (primary) hypertension: Secondary | ICD-10-CM | POA: Diagnosis not present

## 2017-10-08 DIAGNOSIS — R809 Proteinuria, unspecified: Secondary | ICD-10-CM | POA: Diagnosis not present

## 2017-10-08 DIAGNOSIS — N200 Calculus of kidney: Secondary | ICD-10-CM | POA: Diagnosis not present

## 2017-10-19 DIAGNOSIS — E213 Hyperparathyroidism, unspecified: Secondary | ICD-10-CM | POA: Diagnosis not present

## 2017-11-02 DIAGNOSIS — E213 Hyperparathyroidism, unspecified: Secondary | ICD-10-CM | POA: Diagnosis not present

## 2017-11-10 DIAGNOSIS — E213 Hyperparathyroidism, unspecified: Secondary | ICD-10-CM | POA: Diagnosis not present

## 2017-11-10 DIAGNOSIS — E041 Nontoxic single thyroid nodule: Secondary | ICD-10-CM | POA: Diagnosis not present

## 2017-11-30 DIAGNOSIS — I10 Essential (primary) hypertension: Secondary | ICD-10-CM | POA: Diagnosis not present

## 2017-11-30 DIAGNOSIS — E119 Type 2 diabetes mellitus without complications: Secondary | ICD-10-CM | POA: Diagnosis not present

## 2017-11-30 DIAGNOSIS — Z139 Encounter for screening, unspecified: Secondary | ICD-10-CM | POA: Diagnosis not present

## 2017-11-30 DIAGNOSIS — N184 Chronic kidney disease, stage 4 (severe): Secondary | ICD-10-CM | POA: Diagnosis not present

## 2017-11-30 DIAGNOSIS — R6889 Other general symptoms and signs: Secondary | ICD-10-CM | POA: Diagnosis not present

## 2017-11-30 DIAGNOSIS — G4700 Insomnia, unspecified: Secondary | ICD-10-CM | POA: Diagnosis not present

## 2017-11-30 DIAGNOSIS — E210 Primary hyperparathyroidism: Secondary | ICD-10-CM | POA: Diagnosis not present

## 2017-12-03 ENCOUNTER — Other Ambulatory Visit: Payer: Self-pay | Admitting: *Deleted

## 2017-12-03 DIAGNOSIS — C61 Malignant neoplasm of prostate: Secondary | ICD-10-CM

## 2017-12-20 ENCOUNTER — Other Ambulatory Visit: Payer: Self-pay

## 2017-12-20 ENCOUNTER — Inpatient Hospital Stay: Payer: Medicare HMO | Attending: Radiation Oncology

## 2017-12-20 ENCOUNTER — Ambulatory Visit
Admission: RE | Admit: 2017-12-20 | Discharge: 2017-12-20 | Disposition: A | Discharge status: Home / Self Care | Payer: Medicare HMO | Source: Ambulatory Visit | Attending: Radiation Oncology | Admitting: Radiation Oncology

## 2017-12-20 ENCOUNTER — Other Ambulatory Visit: Payer: Self-pay | Admitting: *Deleted

## 2017-12-20 VITALS — BP 166/86 | HR 69 | Temp 97.8°F | Resp 12 | Ht 69.0 in | Wt 245.3 lb

## 2017-12-20 DIAGNOSIS — C61 Malignant neoplasm of prostate: Secondary | ICD-10-CM

## 2017-12-20 DIAGNOSIS — Z923 Personal history of irradiation: Secondary | ICD-10-CM | POA: Insufficient documentation

## 2017-12-20 LAB — PSA: PROSTATIC SPECIFIC ANTIGEN: 0.18 ng/mL (ref 0.00–4.00)

## 2017-12-20 NOTE — Progress Notes (Signed)
Radiation Oncology Follow up Note  Name: Nathan Knapp   Date:   12/20/2017 MRN:  [id number] DOB: Jan 31, 1951    This 67 y.o. male presents to the clinic today for 3 year follow-up for stage II prostate cancer presenting with a Gleason score of 7.  REFERRING PROVIDER: [name], PA-C  HPI: patient is a 66 year old male now out 3 years having completed IM RT radiation therapy to his prostate and pelvic nodes for Gleason 7 (4+3) adenocarcinoma presenting the PSA of 25. He is seen today in routine follow-up and is doing well..he specifically denies any increasing lower urinary tract symptoms diarrhea incontinence or fatigue. We've run a PSA level on today and will report that separately. His PSAs have been in the 0.2 range going back to past 2 years.  COMPLICATIONS OF TREATMENT: none  FOLLOW UP COMPLIANCE: keeps appointments   PHYSICAL EXAM:  BP (!) 166/86 (BP Location: Left Arm, Patient Position: Sitting, Cuff Size: Large)   Pulse 69   Temp 97.8 F (36.6 C) (Tympanic)   Resp 12   Ht 5\' 9"  (1.753 m)   Wt 245 lb 4.2 oz (111.3 kg)   BMI 36.22 kg/m  Well-developed well-nourished patient in NAD. HEENT reveals PERLA, EOMI, discs not visualized.  Oral cavity is clear. No oral mucosal lesions are identified. Neck is clear without evidence of cervical or supraclavicular adenopathy. Lungs are clear to A&P. Cardiac examination is essentially unremarkable with regular rate and rhythm without murmur rub or thrill. Abdomen is benign with no organomegaly or masses noted. Motor sensory and DTR levels are equal and symmetric in the upper and lower extremities. Cranial nerves II through XII are grossly intact. Proprioception is intact. No peripheral adenopathy or edema is identified. No motor or sensory levels are noted. Crude visual fields are within normal range.  RADIOLOGY RESULTS: no current films for review  PLAN: present time patient is doing well will review his PSA when it becomes available. Will  make further recognitions based on that value. Otherwise from a side effect affect standpoint he is doing extremely well. I've asked to see him back in 1 year for follow-up. Patient knows to call with any concerns.  I would like to take this opportunity to thank you for allowing me to participate in the care of your patient.Noreene Filbert, MD

## 2018-01-19 DIAGNOSIS — H401133 Primary open-angle glaucoma, bilateral, severe stage: Secondary | ICD-10-CM | POA: Diagnosis not present

## 2018-02-02 DIAGNOSIS — H401133 Primary open-angle glaucoma, bilateral, severe stage: Secondary | ICD-10-CM | POA: Diagnosis not present

## 2018-02-02 DIAGNOSIS — H04123 Dry eye syndrome of bilateral lacrimal glands: Secondary | ICD-10-CM | POA: Diagnosis not present

## 2018-02-02 DIAGNOSIS — H25013 Cortical age-related cataract, bilateral: Secondary | ICD-10-CM | POA: Diagnosis not present

## 2018-02-08 DIAGNOSIS — I10 Essential (primary) hypertension: Secondary | ICD-10-CM | POA: Diagnosis not present

## 2018-02-08 DIAGNOSIS — E210 Primary hyperparathyroidism: Secondary | ICD-10-CM | POA: Diagnosis not present

## 2018-02-08 DIAGNOSIS — N200 Calculus of kidney: Secondary | ICD-10-CM | POA: Diagnosis not present

## 2018-02-08 DIAGNOSIS — N184 Chronic kidney disease, stage 4 (severe): Secondary | ICD-10-CM | POA: Diagnosis not present

## 2018-02-08 DIAGNOSIS — R809 Proteinuria, unspecified: Secondary | ICD-10-CM | POA: Diagnosis not present

## 2018-03-02 ENCOUNTER — Other Ambulatory Visit: Payer: Self-pay | Admitting: "Endocrinology

## 2018-03-02 DIAGNOSIS — E213 Hyperparathyroidism, unspecified: Secondary | ICD-10-CM

## 2018-03-11 ENCOUNTER — Encounter
Admission: RE | Admit: 2018-03-11 | Discharge: 2018-03-11 | Disposition: A | Discharge status: Home / Self Care | Payer: Medicare HMO | Source: Ambulatory Visit | Attending: "Endocrinology | Admitting: "Endocrinology

## 2018-04-13 DIAGNOSIS — H401131 Primary open-angle glaucoma, bilateral, mild stage: Secondary | ICD-10-CM | POA: Diagnosis not present

## 2018-05-17 DIAGNOSIS — N200 Calculus of kidney: Secondary | ICD-10-CM | POA: Diagnosis not present

## 2018-05-17 DIAGNOSIS — R600 Localized edema: Secondary | ICD-10-CM | POA: Diagnosis not present

## 2018-05-17 DIAGNOSIS — I10 Essential (primary) hypertension: Secondary | ICD-10-CM | POA: Diagnosis not present

## 2018-05-17 DIAGNOSIS — N183 Chronic kidney disease, stage 3 (moderate): Secondary | ICD-10-CM | POA: Diagnosis not present

## 2018-05-17 DIAGNOSIS — R809 Proteinuria, unspecified: Secondary | ICD-10-CM | POA: Diagnosis not present

## 2018-05-19 DIAGNOSIS — Z1331 Encounter for screening for depression: Secondary | ICD-10-CM | POA: Diagnosis not present

## 2018-05-19 DIAGNOSIS — M79641 Pain in right hand: Secondary | ICD-10-CM | POA: Diagnosis not present

## 2018-05-19 DIAGNOSIS — N184 Chronic kidney disease, stage 4 (severe): Secondary | ICD-10-CM | POA: Diagnosis not present

## 2018-05-19 DIAGNOSIS — E7800 Pure hypercholesterolemia, unspecified: Secondary | ICD-10-CM | POA: Diagnosis not present

## 2018-05-19 DIAGNOSIS — I10 Essential (primary) hypertension: Secondary | ICD-10-CM | POA: Diagnosis not present

## 2018-05-19 DIAGNOSIS — Z9181 History of falling: Secondary | ICD-10-CM | POA: Diagnosis not present

## 2018-05-19 DIAGNOSIS — E119 Type 2 diabetes mellitus without complications: Secondary | ICD-10-CM | POA: Diagnosis not present

## 2018-05-19 DIAGNOSIS — Z1339 Encounter for screening examination for other mental health and behavioral disorders: Secondary | ICD-10-CM | POA: Diagnosis not present

## 2018-05-19 DIAGNOSIS — Z1389 Encounter for screening for other disorder: Secondary | ICD-10-CM | POA: Diagnosis not present

## 2018-06-10 DIAGNOSIS — Z125 Encounter for screening for malignant neoplasm of prostate: Secondary | ICD-10-CM | POA: Diagnosis not present

## 2018-06-10 DIAGNOSIS — E785 Hyperlipidemia, unspecified: Secondary | ICD-10-CM | POA: Diagnosis not present

## 2018-06-10 DIAGNOSIS — Z139 Encounter for screening, unspecified: Secondary | ICD-10-CM | POA: Diagnosis not present

## 2018-06-10 DIAGNOSIS — E669 Obesity, unspecified: Secondary | ICD-10-CM | POA: Diagnosis not present

## 2018-06-10 DIAGNOSIS — Z0000 Encounter for general adult medical examination without abnormal findings: Secondary | ICD-10-CM | POA: Diagnosis not present

## 2018-06-10 DIAGNOSIS — Z6835 Body mass index (BMI) 35.0-35.9, adult: Secondary | ICD-10-CM | POA: Diagnosis not present

## 2018-06-10 DIAGNOSIS — Z136 Encounter for screening for cardiovascular disorders: Secondary | ICD-10-CM | POA: Diagnosis not present

## 2018-06-17 DIAGNOSIS — H2513 Age-related nuclear cataract, bilateral: Secondary | ICD-10-CM | POA: Diagnosis not present

## 2018-06-17 DIAGNOSIS — H40013 Open angle with borderline findings, low risk, bilateral: Secondary | ICD-10-CM | POA: Diagnosis not present

## 2018-06-17 DIAGNOSIS — H401132 Primary open-angle glaucoma, bilateral, moderate stage: Secondary | ICD-10-CM | POA: Diagnosis not present

## 2018-06-17 DIAGNOSIS — H47012 Ischemic optic neuropathy, left eye: Secondary | ICD-10-CM | POA: Diagnosis not present

## 2018-08-31 DIAGNOSIS — R600 Localized edema: Secondary | ICD-10-CM | POA: Diagnosis not present

## 2018-08-31 DIAGNOSIS — N184 Chronic kidney disease, stage 4 (severe): Secondary | ICD-10-CM | POA: Diagnosis not present

## 2018-08-31 DIAGNOSIS — I129 Hypertensive chronic kidney disease with stage 1 through stage 4 chronic kidney disease, or unspecified chronic kidney disease: Secondary | ICD-10-CM | POA: Diagnosis not present

## 2018-09-27 DIAGNOSIS — Z23 Encounter for immunization: Secondary | ICD-10-CM | POA: Diagnosis not present

## 2018-09-27 DIAGNOSIS — E119 Type 2 diabetes mellitus without complications: Secondary | ICD-10-CM | POA: Diagnosis not present

## 2018-09-27 DIAGNOSIS — I10 Essential (primary) hypertension: Secondary | ICD-10-CM | POA: Diagnosis not present

## 2018-09-27 DIAGNOSIS — E7800 Pure hypercholesterolemia, unspecified: Secondary | ICD-10-CM | POA: Diagnosis not present

## 2018-09-27 DIAGNOSIS — H409 Unspecified glaucoma: Secondary | ICD-10-CM | POA: Diagnosis not present

## 2018-09-27 DIAGNOSIS — G4700 Insomnia, unspecified: Secondary | ICD-10-CM | POA: Diagnosis not present

## 2018-09-27 DIAGNOSIS — N184 Chronic kidney disease, stage 4 (severe): Secondary | ICD-10-CM | POA: Diagnosis not present

## 2018-09-27 DIAGNOSIS — Z6834 Body mass index (BMI) 34.0-34.9, adult: Secondary | ICD-10-CM | POA: Diagnosis not present

## 2018-10-21 DIAGNOSIS — H401132 Primary open-angle glaucoma, bilateral, moderate stage: Secondary | ICD-10-CM | POA: Diagnosis not present

## 2018-10-21 DIAGNOSIS — H40013 Open angle with borderline findings, low risk, bilateral: Secondary | ICD-10-CM | POA: Diagnosis not present

## 2018-10-21 DIAGNOSIS — H47012 Ischemic optic neuropathy, left eye: Secondary | ICD-10-CM | POA: Diagnosis not present

## 2018-10-21 DIAGNOSIS — H2513 Age-related nuclear cataract, bilateral: Secondary | ICD-10-CM | POA: Diagnosis not present

## 2018-11-14 DIAGNOSIS — H34232 Retinal artery branch occlusion, left eye: Secondary | ICD-10-CM | POA: Diagnosis not present

## 2018-11-14 DIAGNOSIS — I10 Essential (primary) hypertension: Secondary | ICD-10-CM | POA: Diagnosis not present

## 2018-11-14 DIAGNOSIS — E119 Type 2 diabetes mellitus without complications: Secondary | ICD-10-CM | POA: Diagnosis not present

## 2018-11-14 DIAGNOSIS — H409 Unspecified glaucoma: Secondary | ICD-10-CM | POA: Diagnosis not present

## 2018-11-14 DIAGNOSIS — Z6834 Body mass index (BMI) 34.0-34.9, adult: Secondary | ICD-10-CM | POA: Diagnosis not present

## 2018-12-07 DIAGNOSIS — N184 Chronic kidney disease, stage 4 (severe): Secondary | ICD-10-CM | POA: Diagnosis not present

## 2018-12-07 DIAGNOSIS — Z602 Problems related to living alone: Secondary | ICD-10-CM | POA: Diagnosis not present

## 2018-12-07 DIAGNOSIS — I129 Hypertensive chronic kidney disease with stage 1 through stage 4 chronic kidney disease, or unspecified chronic kidney disease: Secondary | ICD-10-CM | POA: Diagnosis not present

## 2018-12-07 DIAGNOSIS — E119 Type 2 diabetes mellitus without complications: Secondary | ICD-10-CM | POA: Diagnosis not present

## 2018-12-07 DIAGNOSIS — H409 Unspecified glaucoma: Secondary | ICD-10-CM | POA: Diagnosis not present

## 2018-12-07 DIAGNOSIS — M19049 Primary osteoarthritis, unspecified hand: Secondary | ICD-10-CM | POA: Diagnosis not present

## 2018-12-07 DIAGNOSIS — H34232 Retinal artery branch occlusion, left eye: Secondary | ICD-10-CM | POA: Diagnosis not present

## 2018-12-12 DIAGNOSIS — Z602 Problems related to living alone: Secondary | ICD-10-CM | POA: Diagnosis not present

## 2018-12-12 DIAGNOSIS — N184 Chronic kidney disease, stage 4 (severe): Secondary | ICD-10-CM | POA: Diagnosis not present

## 2018-12-12 DIAGNOSIS — H34232 Retinal artery branch occlusion, left eye: Secondary | ICD-10-CM | POA: Diagnosis not present

## 2018-12-12 DIAGNOSIS — E119 Type 2 diabetes mellitus without complications: Secondary | ICD-10-CM | POA: Diagnosis not present

## 2018-12-12 DIAGNOSIS — M19049 Primary osteoarthritis, unspecified hand: Secondary | ICD-10-CM | POA: Diagnosis not present

## 2018-12-12 DIAGNOSIS — I129 Hypertensive chronic kidney disease with stage 1 through stage 4 chronic kidney disease, or unspecified chronic kidney disease: Secondary | ICD-10-CM | POA: Diagnosis not present

## 2018-12-12 DIAGNOSIS — H409 Unspecified glaucoma: Secondary | ICD-10-CM | POA: Diagnosis not present

## 2018-12-14 DIAGNOSIS — N184 Chronic kidney disease, stage 4 (severe): Secondary | ICD-10-CM | POA: Diagnosis not present

## 2018-12-14 DIAGNOSIS — I129 Hypertensive chronic kidney disease with stage 1 through stage 4 chronic kidney disease, or unspecified chronic kidney disease: Secondary | ICD-10-CM | POA: Diagnosis not present

## 2018-12-14 DIAGNOSIS — Z602 Problems related to living alone: Secondary | ICD-10-CM | POA: Diagnosis not present

## 2018-12-14 DIAGNOSIS — E119 Type 2 diabetes mellitus without complications: Secondary | ICD-10-CM | POA: Diagnosis not present

## 2018-12-14 DIAGNOSIS — H409 Unspecified glaucoma: Secondary | ICD-10-CM | POA: Diagnosis not present

## 2018-12-14 DIAGNOSIS — M19049 Primary osteoarthritis, unspecified hand: Secondary | ICD-10-CM | POA: Diagnosis not present

## 2018-12-14 DIAGNOSIS — H34232 Retinal artery branch occlusion, left eye: Secondary | ICD-10-CM | POA: Diagnosis not present

## 2018-12-16 DIAGNOSIS — N184 Chronic kidney disease, stage 4 (severe): Secondary | ICD-10-CM | POA: Diagnosis not present

## 2018-12-16 DIAGNOSIS — H34232 Retinal artery branch occlusion, left eye: Secondary | ICD-10-CM | POA: Diagnosis not present

## 2018-12-16 DIAGNOSIS — I129 Hypertensive chronic kidney disease with stage 1 through stage 4 chronic kidney disease, or unspecified chronic kidney disease: Secondary | ICD-10-CM | POA: Diagnosis not present

## 2018-12-16 DIAGNOSIS — E119 Type 2 diabetes mellitus without complications: Secondary | ICD-10-CM | POA: Diagnosis not present

## 2018-12-16 DIAGNOSIS — Z602 Problems related to living alone: Secondary | ICD-10-CM | POA: Diagnosis not present

## 2018-12-16 DIAGNOSIS — H409 Unspecified glaucoma: Secondary | ICD-10-CM | POA: Diagnosis not present

## 2018-12-16 DIAGNOSIS — M19049 Primary osteoarthritis, unspecified hand: Secondary | ICD-10-CM | POA: Diagnosis not present

## 2018-12-19 ENCOUNTER — Inpatient Hospital Stay: Payer: Medicare HMO

## 2018-12-21 DIAGNOSIS — M19049 Primary osteoarthritis, unspecified hand: Secondary | ICD-10-CM | POA: Diagnosis not present

## 2018-12-21 DIAGNOSIS — H409 Unspecified glaucoma: Secondary | ICD-10-CM | POA: Diagnosis not present

## 2018-12-21 DIAGNOSIS — H34232 Retinal artery branch occlusion, left eye: Secondary | ICD-10-CM | POA: Diagnosis not present

## 2018-12-21 DIAGNOSIS — Z602 Problems related to living alone: Secondary | ICD-10-CM | POA: Diagnosis not present

## 2018-12-21 DIAGNOSIS — I129 Hypertensive chronic kidney disease with stage 1 through stage 4 chronic kidney disease, or unspecified chronic kidney disease: Secondary | ICD-10-CM | POA: Diagnosis not present

## 2018-12-21 DIAGNOSIS — E119 Type 2 diabetes mellitus without complications: Secondary | ICD-10-CM | POA: Diagnosis not present

## 2018-12-21 DIAGNOSIS — N184 Chronic kidney disease, stage 4 (severe): Secondary | ICD-10-CM | POA: Diagnosis not present

## 2018-12-23 DIAGNOSIS — H34232 Retinal artery branch occlusion, left eye: Secondary | ICD-10-CM | POA: Diagnosis not present

## 2018-12-23 DIAGNOSIS — I129 Hypertensive chronic kidney disease with stage 1 through stage 4 chronic kidney disease, or unspecified chronic kidney disease: Secondary | ICD-10-CM | POA: Diagnosis not present

## 2018-12-23 DIAGNOSIS — Z602 Problems related to living alone: Secondary | ICD-10-CM | POA: Diagnosis not present

## 2018-12-23 DIAGNOSIS — H409 Unspecified glaucoma: Secondary | ICD-10-CM | POA: Diagnosis not present

## 2018-12-23 DIAGNOSIS — N184 Chronic kidney disease, stage 4 (severe): Secondary | ICD-10-CM | POA: Diagnosis not present

## 2018-12-23 DIAGNOSIS — E119 Type 2 diabetes mellitus without complications: Secondary | ICD-10-CM | POA: Diagnosis not present

## 2018-12-23 DIAGNOSIS — M19049 Primary osteoarthritis, unspecified hand: Secondary | ICD-10-CM | POA: Diagnosis not present

## 2018-12-26 ENCOUNTER — Ambulatory Visit: Payer: Medicare HMO | Admitting: Radiation Oncology

## 2018-12-26 DIAGNOSIS — N184 Chronic kidney disease, stage 4 (severe): Secondary | ICD-10-CM | POA: Diagnosis not present

## 2018-12-26 DIAGNOSIS — E119 Type 2 diabetes mellitus without complications: Secondary | ICD-10-CM | POA: Diagnosis not present

## 2018-12-26 DIAGNOSIS — M19049 Primary osteoarthritis, unspecified hand: Secondary | ICD-10-CM | POA: Diagnosis not present

## 2018-12-26 DIAGNOSIS — H409 Unspecified glaucoma: Secondary | ICD-10-CM | POA: Diagnosis not present

## 2018-12-26 DIAGNOSIS — I129 Hypertensive chronic kidney disease with stage 1 through stage 4 chronic kidney disease, or unspecified chronic kidney disease: Secondary | ICD-10-CM | POA: Diagnosis not present

## 2018-12-26 DIAGNOSIS — Z602 Problems related to living alone: Secondary | ICD-10-CM | POA: Diagnosis not present

## 2018-12-26 DIAGNOSIS — H34232 Retinal artery branch occlusion, left eye: Secondary | ICD-10-CM | POA: Diagnosis not present

## 2019-01-12 DIAGNOSIS — H34232 Retinal artery branch occlusion, left eye: Secondary | ICD-10-CM | POA: Diagnosis not present

## 2019-01-12 DIAGNOSIS — I129 Hypertensive chronic kidney disease with stage 1 through stage 4 chronic kidney disease, or unspecified chronic kidney disease: Secondary | ICD-10-CM | POA: Diagnosis not present

## 2019-01-12 DIAGNOSIS — E119 Type 2 diabetes mellitus without complications: Secondary | ICD-10-CM | POA: Diagnosis not present

## 2019-01-12 DIAGNOSIS — H409 Unspecified glaucoma: Secondary | ICD-10-CM | POA: Diagnosis not present

## 2019-01-12 DIAGNOSIS — M19049 Primary osteoarthritis, unspecified hand: Secondary | ICD-10-CM | POA: Diagnosis not present

## 2019-01-12 DIAGNOSIS — N184 Chronic kidney disease, stage 4 (severe): Secondary | ICD-10-CM | POA: Diagnosis not present

## 2019-01-12 DIAGNOSIS — Z602 Problems related to living alone: Secondary | ICD-10-CM | POA: Diagnosis not present

## 2019-01-25 DIAGNOSIS — N184 Chronic kidney disease, stage 4 (severe): Secondary | ICD-10-CM | POA: Diagnosis not present

## 2019-01-25 DIAGNOSIS — E119 Type 2 diabetes mellitus without complications: Secondary | ICD-10-CM | POA: Diagnosis not present

## 2019-01-25 DIAGNOSIS — G4700 Insomnia, unspecified: Secondary | ICD-10-CM | POA: Diagnosis not present

## 2019-01-25 DIAGNOSIS — H34232 Retinal artery branch occlusion, left eye: Secondary | ICD-10-CM | POA: Diagnosis not present

## 2019-01-25 DIAGNOSIS — H409 Unspecified glaucoma: Secondary | ICD-10-CM | POA: Diagnosis not present

## 2019-01-25 DIAGNOSIS — I10 Essential (primary) hypertension: Secondary | ICD-10-CM | POA: Diagnosis not present

## 2019-01-25 DIAGNOSIS — M25512 Pain in left shoulder: Secondary | ICD-10-CM | POA: Diagnosis not present

## 2019-02-15 ENCOUNTER — Other Ambulatory Visit: Payer: Self-pay

## 2019-02-15 ENCOUNTER — Inpatient Hospital Stay: Payer: Medicare HMO | Attending: Radiation Oncology

## 2019-02-15 ENCOUNTER — Encounter (INDEPENDENT_AMBULATORY_CARE_PROVIDER_SITE_OTHER): Payer: Self-pay

## 2019-02-15 DIAGNOSIS — C61 Malignant neoplasm of prostate: Secondary | ICD-10-CM | POA: Diagnosis not present

## 2019-02-15 LAB — PSA: Prostatic Specific Antigen: 0.26 ng/mL (ref 0.00–4.00)

## 2019-02-22 ENCOUNTER — Encounter: Payer: Self-pay | Admitting: Radiation Oncology

## 2019-02-22 ENCOUNTER — Ambulatory Visit
Admission: RE | Admit: 2019-02-22 | Discharge: 2019-02-22 | Disposition: A | Discharge status: Home / Self Care | Payer: Medicare HMO | Source: Ambulatory Visit | Attending: Radiation Oncology | Admitting: Radiation Oncology

## 2019-02-22 ENCOUNTER — Other Ambulatory Visit: Payer: Self-pay

## 2019-02-22 VITALS — BP 168/76 | HR 62 | Temp 95.1°F | Resp 18 | Wt 222.0 lb

## 2019-02-22 DIAGNOSIS — C61 Malignant neoplasm of prostate: Secondary | ICD-10-CM

## 2019-02-22 DIAGNOSIS — Z923 Personal history of irradiation: Secondary | ICD-10-CM | POA: Insufficient documentation

## 2019-02-22 DIAGNOSIS — R9721 Rising PSA following treatment for malignant neoplasm of prostate: Secondary | ICD-10-CM | POA: Diagnosis not present

## 2019-02-22 NOTE — Progress Notes (Signed)
Radiation Oncology Follow up Note  Name: Nathan Knapp   Date:   02/22/2019 MRN:  [id number] DOB: 31-Dec-1950    This 67 y.o. male presents to the clinic today for 4-year follow-up status post IMRT radiation therapy to his prostate and pelvic nodes for Gleason 7 (4+3) adenocarcinoma presenting with a PSA of 25.  REFERRING PROVIDER: [name], PA-C  HPI: Patient is a 68 year old male now seen now for years having completed IMRT radiation therapy to his prostate and pelvic nodes for stage IIb (T1 cN0 M0) Gleason 7 (4+3) adenocarcinoma the prostate presenting with a PSA of 25.  He is seen today in routine follow-up and is doing well.Nathan Knapp  His PSA is 0.26 up slightly from a year ago at 0.18.  He specifically denies any increased lower urinary tract symptoms diarrhea fatigue.  COMPLICATIONS OF TREATMENT: none  FOLLOW UP COMPLIANCE: keeps appointments   PHYSICAL EXAM:  BP (!) 168/76 (BP Location: Left Arm, Patient Position: Sitting)   Pulse 62   Temp (!) 95.1 F (35.1 C) (Tympanic)   Resp 18   Wt 222 lb 0.1 oz (100.7 kg)   BMI 32.78 kg/m  Well-developed well-nourished patient in NAD. HEENT reveals PERLA, EOMI, discs not visualized.  Oral cavity is clear. No oral mucosal lesions are identified. Neck is clear without evidence of cervical or supraclavicular adenopathy. Lungs are clear to A&P. Cardiac examination is essentially unremarkable with regular rate and rhythm without murmur rub or thrill. Abdomen is benign with no organomegaly or masses noted. Motor sensory and DTR levels are equal and symmetric in the upper and lower extremities. Cranial nerves II through XII are grossly intact. Proprioception is intact. No peripheral adenopathy or edema is identified. No motor or sensory levels are noted. Crude visual fields are within normal range.  RADIOLOGY RESULTS: No current films for review  PLAN: Present time patient is doing well under excellent biochemical control of his prostate cancer has had  a slight bump in his PSA although not significant.  I have asked to see him back in 1 year for follow-up with a PSA prior to that visit.  Patient is to call sooner with any concerns at any time.  I would like to take this opportunity to thank you for allowing me to participate in the care of your patient.Noreene Filbert, MD

## 2019-03-17 DIAGNOSIS — I129 Hypertensive chronic kidney disease with stage 1 through stage 4 chronic kidney disease, or unspecified chronic kidney disease: Secondary | ICD-10-CM | POA: Diagnosis not present

## 2019-03-17 DIAGNOSIS — N184 Chronic kidney disease, stage 4 (severe): Secondary | ICD-10-CM | POA: Diagnosis not present

## 2019-03-17 DIAGNOSIS — R809 Proteinuria, unspecified: Secondary | ICD-10-CM | POA: Diagnosis not present

## 2019-04-21 DIAGNOSIS — H401132 Primary open-angle glaucoma, bilateral, moderate stage: Secondary | ICD-10-CM | POA: Diagnosis not present

## 2019-04-21 DIAGNOSIS — H40013 Open angle with borderline findings, low risk, bilateral: Secondary | ICD-10-CM | POA: Diagnosis not present

## 2019-04-21 DIAGNOSIS — H2513 Age-related nuclear cataract, bilateral: Secondary | ICD-10-CM | POA: Diagnosis not present

## 2019-04-21 DIAGNOSIS — H47012 Ischemic optic neuropathy, left eye: Secondary | ICD-10-CM | POA: Diagnosis not present

## 2019-05-29 DIAGNOSIS — E213 Hyperparathyroidism, unspecified: Secondary | ICD-10-CM | POA: Diagnosis not present

## 2019-05-29 DIAGNOSIS — C61 Malignant neoplasm of prostate: Secondary | ICD-10-CM | POA: Diagnosis not present

## 2019-05-29 DIAGNOSIS — E1122 Type 2 diabetes mellitus with diabetic chronic kidney disease: Secondary | ICD-10-CM | POA: Diagnosis not present

## 2019-05-29 DIAGNOSIS — E119 Type 2 diabetes mellitus without complications: Secondary | ICD-10-CM | POA: Diagnosis not present

## 2019-05-29 DIAGNOSIS — N184 Chronic kidney disease, stage 4 (severe): Secondary | ICD-10-CM | POA: Diagnosis not present

## 2019-05-29 DIAGNOSIS — G4700 Insomnia, unspecified: Secondary | ICD-10-CM | POA: Diagnosis not present

## 2019-05-29 DIAGNOSIS — Z1331 Encounter for screening for depression: Secondary | ICD-10-CM | POA: Diagnosis not present

## 2019-05-29 DIAGNOSIS — I10 Essential (primary) hypertension: Secondary | ICD-10-CM | POA: Diagnosis not present

## 2019-05-29 DIAGNOSIS — Z9181 History of falling: Secondary | ICD-10-CM | POA: Diagnosis not present

## 2019-05-29 DIAGNOSIS — E7800 Pure hypercholesterolemia, unspecified: Secondary | ICD-10-CM | POA: Diagnosis not present

## 2019-05-29 DIAGNOSIS — H409 Unspecified glaucoma: Secondary | ICD-10-CM | POA: Diagnosis not present

## 2019-06-12 DIAGNOSIS — Z1211 Encounter for screening for malignant neoplasm of colon: Secondary | ICD-10-CM | POA: Diagnosis not present

## 2019-06-12 DIAGNOSIS — Z9181 History of falling: Secondary | ICD-10-CM | POA: Diagnosis not present

## 2019-06-12 DIAGNOSIS — E785 Hyperlipidemia, unspecified: Secondary | ICD-10-CM | POA: Diagnosis not present

## 2019-06-12 DIAGNOSIS — Z1331 Encounter for screening for depression: Secondary | ICD-10-CM | POA: Diagnosis not present

## 2019-06-12 DIAGNOSIS — Z125 Encounter for screening for malignant neoplasm of prostate: Secondary | ICD-10-CM | POA: Diagnosis not present

## 2019-06-12 DIAGNOSIS — Z6833 Body mass index (BMI) 33.0-33.9, adult: Secondary | ICD-10-CM | POA: Diagnosis not present

## 2019-06-12 DIAGNOSIS — Z0000 Encounter for general adult medical examination without abnormal findings: Secondary | ICD-10-CM | POA: Diagnosis not present

## 2019-09-18 DIAGNOSIS — N184 Chronic kidney disease, stage 4 (severe): Secondary | ICD-10-CM | POA: Diagnosis not present

## 2019-09-18 DIAGNOSIS — E210 Primary hyperparathyroidism: Secondary | ICD-10-CM | POA: Diagnosis not present

## 2019-09-18 DIAGNOSIS — R801 Persistent proteinuria, unspecified: Secondary | ICD-10-CM | POA: Diagnosis not present

## 2019-09-18 DIAGNOSIS — I10 Essential (primary) hypertension: Secondary | ICD-10-CM | POA: Diagnosis not present

## 2019-10-23 DIAGNOSIS — R0989 Other specified symptoms and signs involving the circulatory and respiratory systems: Secondary | ICD-10-CM | POA: Diagnosis not present

## 2019-10-23 DIAGNOSIS — H409 Unspecified glaucoma: Secondary | ICD-10-CM | POA: Diagnosis not present

## 2019-10-23 DIAGNOSIS — I10 Essential (primary) hypertension: Secondary | ICD-10-CM | POA: Diagnosis not present

## 2019-10-23 DIAGNOSIS — G4700 Insomnia, unspecified: Secondary | ICD-10-CM | POA: Diagnosis not present

## 2019-10-23 DIAGNOSIS — Z6834 Body mass index (BMI) 34.0-34.9, adult: Secondary | ICD-10-CM | POA: Diagnosis not present

## 2019-10-23 DIAGNOSIS — Z1211 Encounter for screening for malignant neoplasm of colon: Secondary | ICD-10-CM | POA: Diagnosis not present

## 2019-10-23 DIAGNOSIS — N184 Chronic kidney disease, stage 4 (severe): Secondary | ICD-10-CM | POA: Diagnosis not present

## 2019-10-23 DIAGNOSIS — E1122 Type 2 diabetes mellitus with diabetic chronic kidney disease: Secondary | ICD-10-CM | POA: Diagnosis not present

## 2019-10-23 DIAGNOSIS — E7800 Pure hypercholesterolemia, unspecified: Secondary | ICD-10-CM | POA: Diagnosis not present

## 2019-10-23 DIAGNOSIS — E119 Type 2 diabetes mellitus without complications: Secondary | ICD-10-CM | POA: Diagnosis not present

## 2019-10-23 DIAGNOSIS — E213 Hyperparathyroidism, unspecified: Secondary | ICD-10-CM | POA: Diagnosis not present

## 2019-10-27 ENCOUNTER — Other Ambulatory Visit (HOSPITAL_COMMUNITY): Payer: Self-pay | Admitting: Physician Assistant

## 2019-10-27 ENCOUNTER — Other Ambulatory Visit: Payer: Self-pay | Admitting: Physician Assistant

## 2019-10-27 DIAGNOSIS — R0989 Other specified symptoms and signs involving the circulatory and respiratory systems: Secondary | ICD-10-CM

## 2019-11-01 ENCOUNTER — Ambulatory Visit: Payer: Medicare HMO

## 2019-11-01 DIAGNOSIS — N184 Chronic kidney disease, stage 4 (severe): Secondary | ICD-10-CM | POA: Diagnosis not present

## 2019-11-15 ENCOUNTER — Other Ambulatory Visit: Payer: Self-pay

## 2019-11-15 ENCOUNTER — Ambulatory Visit
Admission: RE | Admit: 2019-11-15 | Discharge: 2019-11-15 | Disposition: A | Discharge status: Home / Self Care | Payer: Medicare HMO | Source: Ambulatory Visit | Attending: Physician Assistant | Admitting: Physician Assistant

## 2019-11-15 DIAGNOSIS — R0989 Other specified symptoms and signs involving the circulatory and respiratory systems: Secondary | ICD-10-CM | POA: Diagnosis not present

## 2019-11-15 DIAGNOSIS — I6523 Occlusion and stenosis of bilateral carotid arteries: Secondary | ICD-10-CM | POA: Diagnosis not present

## 2020-01-16 DIAGNOSIS — H52229 Regular astigmatism, unspecified eye: Secondary | ICD-10-CM | POA: Diagnosis not present

## 2020-01-16 DIAGNOSIS — H5203 Hypermetropia, bilateral: Secondary | ICD-10-CM | POA: Diagnosis not present

## 2020-02-08 DIAGNOSIS — I10 Essential (primary) hypertension: Secondary | ICD-10-CM | POA: Diagnosis not present

## 2020-02-08 DIAGNOSIS — R801 Persistent proteinuria, unspecified: Secondary | ICD-10-CM | POA: Diagnosis not present

## 2020-02-08 DIAGNOSIS — E210 Primary hyperparathyroidism: Secondary | ICD-10-CM | POA: Diagnosis not present

## 2020-02-08 DIAGNOSIS — N184 Chronic kidney disease, stage 4 (severe): Secondary | ICD-10-CM | POA: Diagnosis not present

## 2020-02-12 ENCOUNTER — Other Ambulatory Visit: Payer: Self-pay | Admitting: Nephrology

## 2020-02-12 DIAGNOSIS — M545 Low back pain, unspecified: Secondary | ICD-10-CM

## 2020-02-13 ENCOUNTER — Ambulatory Visit
Admission: RE | Admit: 2020-02-13 | Discharge: 2020-02-13 | Disposition: A | Discharge status: Home / Self Care | Payer: Medicare HMO | Source: Ambulatory Visit | Attending: Nephrology | Admitting: Nephrology

## 2020-02-13 ENCOUNTER — Ambulatory Visit
Admission: RE | Admit: 2020-02-13 | Discharge: 2020-02-13 | Disposition: A | Discharge status: Home / Self Care | Payer: Medicare HMO | Attending: Nephrology | Admitting: Nephrology

## 2020-02-13 ENCOUNTER — Other Ambulatory Visit: Payer: Self-pay

## 2020-02-13 DIAGNOSIS — M545 Low back pain, unspecified: Secondary | ICD-10-CM

## 2020-02-14 ENCOUNTER — Inpatient Hospital Stay: Payer: Medicare HMO

## 2020-02-20 ENCOUNTER — Inpatient Hospital Stay: Payer: Medicare HMO | Attending: Radiation Oncology

## 2020-02-20 ENCOUNTER — Inpatient Hospital Stay: Payer: Medicare HMO

## 2020-02-20 ENCOUNTER — Other Ambulatory Visit: Payer: Self-pay

## 2020-02-20 ENCOUNTER — Encounter: Payer: Self-pay | Admitting: Radiation Oncology

## 2020-02-20 DIAGNOSIS — C61 Malignant neoplasm of prostate: Secondary | ICD-10-CM

## 2020-02-20 LAB — PSA: Prostatic Specific Antigen: 0.18 ng/mL (ref 0.00–4.00)

## 2020-02-21 ENCOUNTER — Inpatient Hospital Stay: Payer: Medicare HMO

## 2020-02-21 ENCOUNTER — Encounter: Payer: Self-pay | Admitting: Radiation Oncology

## 2020-02-21 ENCOUNTER — Ambulatory Visit
Admission: RE | Admit: 2020-02-21 | Discharge: 2020-02-21 | Disposition: A | Discharge status: Home / Self Care | Payer: Medicare HMO | Source: Ambulatory Visit | Attending: Radiation Oncology | Admitting: Radiation Oncology

## 2020-02-21 VITALS — BP 145/74 | HR 64 | Temp 96.9°F | Resp 16 | Wt 229.4 lb

## 2020-02-21 DIAGNOSIS — C61 Malignant neoplasm of prostate: Secondary | ICD-10-CM | POA: Diagnosis not present

## 2020-02-21 DIAGNOSIS — Z923 Personal history of irradiation: Secondary | ICD-10-CM | POA: Insufficient documentation

## 2020-02-21 DIAGNOSIS — C775 Secondary and unspecified malignant neoplasm of intrapelvic lymph nodes: Secondary | ICD-10-CM | POA: Diagnosis not present

## 2020-02-21 NOTE — Progress Notes (Signed)
Radiation Oncology Follow up Note  Name: Nathan Knapp   Date:   02/21/2020 MRN:  [id number] DOB: August 17, 1951    This 69 y.o. male presents to the clinic today for 5-year follow-up status post IMRT radiation therapy to his prostate and pelvic nodes for Gleason 7 (4+3) adenocarcinoma the prostate presenting with a PSA of 25.[name]  REFERRING PROVIDER: [name], PA-C  HPI: Patient is a 69 year old male now out 5 years having completed IMRT radiation therapy to his prostate and pelvic nodes for stage IIb adenocarcinoma of the prostate.  Seen today in routine follow-up he is doing well he specifically denies any increased lower urinary tract symptoms diarrhea or fatigue.  His most recent PSA is 0.18.[name]  His PSA had a slight bump last year although has returned 8.85.  COMPLICATIONS OF TREATMENT: none  FOLLOW UP COMPLIANCE: keeps appointments   PHYSICAL EXAM:  BP (!) 145/74 (BP Location: Left Arm, Patient Position: Sitting, Cuff Size: Large)   Pulse 64   Temp (!) 96.9 F (36.1 C) (Tympanic)   Resp 16   Wt 229 lb 6.4 oz (104.1 kg)   BMI 33.88 kg/m  Well-developed well-nourished patient in NAD. HEENT reveals PERLA, EOMI, discs not visualized.  Oral cavity is clear. No oral mucosal lesions are identified. Neck is clear without evidence of cervical or supraclavicular adenopathy. Lungs are clear to A&P. Cardiac examination is essentially unremarkable with regular rate and rhythm without murmur rub or thrill. Abdomen is benign with no organomegaly or masses noted. Motor sensory and DTR levels are equal and symmetric in the upper and lower extremities. Cranial nerves II through XII are grossly intact. Proprioception is intact. No peripheral adenopathy or edema is identified. No motor or sensory levels are noted. Crude visual fields are within normal range.  RADIOLOGY RESULTS: No current films to review  PLAN: Present time patient is now 5 years out under excellent biochemical control of his prostate  cancer.  I am going to discontinue follow-up care of asked his PMD to do yearly PSA checks and I be happy to reevaluate the patient anytime should further consultation be indicated.  Patient knows to call with any concerns at any time.  I would like to take this opportunity to thank you for allowing me to participate in the care of your patient.Noreene Filbert, MD

## 2020-03-25 DIAGNOSIS — H47012 Ischemic optic neuropathy, left eye: Secondary | ICD-10-CM | POA: Diagnosis not present

## 2020-03-25 DIAGNOSIS — H2513 Age-related nuclear cataract, bilateral: Secondary | ICD-10-CM | POA: Diagnosis not present

## 2020-03-25 DIAGNOSIS — H401131 Primary open-angle glaucoma, bilateral, mild stage: Secondary | ICD-10-CM | POA: Diagnosis not present

## 2020-12-09 ENCOUNTER — Ambulatory Visit: Payer: Self-pay | Admitting: Surgery

## 2021-04-18 ENCOUNTER — Encounter: Payer: Self-pay | Admitting: Internal Medicine

## 2021-04-21 ENCOUNTER — Encounter: Payer: Self-pay | Admitting: Internal Medicine

## 2021-05-22 ENCOUNTER — Other Ambulatory Visit: Payer: Self-pay

## 2021-05-22 DIAGNOSIS — N189 Chronic kidney disease, unspecified: Secondary | ICD-10-CM

## 2021-06-02 NOTE — Progress Notes (Deleted)
[facility] [place]  ASSESSMENT / PLAN: Nathan Knapp is a 70 y.o. *** handed male in need of permanent hemodialysis access. I reviewed options for dialysis in detail with the patient. I counseled the patient that dialysis access requires surveillance and periodic maintenance. Plan to proceed with ***.    CHIEF COMPLAINT: ***  HISTORY OF PRESENT ILLNESS: Nathan Knapp is a 70 y.o. male ***  VASCULAR SURGICAL HISTORY: ***  VASCULAR RISK FACTORS: {FINDINGS; POSITIVE [phone]} history of stroke / transient ischemic attack. {FINDINGS; POSITIVE [phone]} history of coronary artery disease. *** history of PCI. *** history of CABG.  {FINDINGS; POSITIVE [phone]} history of diabetes mellitus. Last A1c ***. {FINDINGS; POSITIVE [phone]} history of smoking. *** actively smoking. {FINDINGS; POSITIVE [phone]} history of hypertension. *** drug regimen with *** control. {FINDINGS; POSITIVE [phone]} history of chronic kidney disease.  Last GFR ***. CKD {stage:30421363}. {FINDINGS; POSITIVE [phone]} history of chronic obstructive pulmonary disease, treated with ***.  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    Vitamin D deficiency     Past Surgical History:  Procedure Laterality Date   NO PAST SURGERIES       Family History  Problem Relation Age of Onset   Prostate cancer Paternal Uncle     Social History   Socioeconomic History   Marital status: Single    Spouse name: Not on file   Number of children: Not on file   Years of education: Not on file   Highest education level: Not on file  Occupational History   Not on file  Tobacco Use   Smoking status: Former   Smokeless tobacco: Never  Vaping Use   Vaping Use: Never used  Substance and Sexual Activity   Alcohol use: No    Alcohol/week: 0.0 standard drinks   Drug use: Never   Sexual activity: Yes    Birth control/protection: Condom  Other Topics Concern   Not on file  Social History Narrative   Not on file   Social Determinants of Health   Financial Resource Strain: Not on file  Food Insecurity: Not on file  Transportation Needs: Not on file  Physical Activity: Not on file  Stress: Not on file  Social Connections: Not on file  Intimate Partner Violence: Not on file    No Known Allergies  Current Outpatient Medications  Medication Sig Dispense Refill   amLODipine (NORVASC) 5 MG tablet      aspirin EC 81 MG tablet Take 81 mg by mouth daily. Swallow whole.     bimatoprost (LUMIGAN) 0.03 % ophthalmic solution Place 1 drop into both eyes at bedtime. Reported on 12/11/2015     brimonidine-timolol (COMBIGAN) 0.2-0.5 % ophthalmic solution Place 1 drop into both eyes every 12 (twelve) hours.     Brinzolamide-Brimonidine (SIMBRINZA) 1-0.2 % SUSP Place 1  drop into both eyes daily.      DIPHENHYDRAMINE HCL PO Take 10 mg by mouth daily at 8 pm.     furosemide (LASIX) 40 MG tablet Take 40 mg by mouth.     hydrochlorothiazide (HYDRODIURIL) 25 MG tablet Take 25 mg by mouth daily. Reported on 12/11/2015     losartan (COZAAR) 100 MG tablet Take 100 mg by mouth daily.      metoprolol succinate (TOPROL-XL) 50 MG 24 hr tablet Take 50 mg by mouth daily.      pravastatin (PRAVACHOL) 40 MG tablet Take 40 mg by mouth daily.       RHOPRESSA 0.02 % SOLN Place 1 drop into both eyes daily.      timolol (TIMOPTIC) 0.25 % ophthalmic solution Place 1 drop into both eyes 2 (two) times daily.      traZODone (DESYREL) 50 MG tablet Take 50 mg by mouth at bedtime as needed.      zolpidem (AMBIEN) 5 MG tablet Take 5 mg by mouth at bedtime as needed.      No current facility-administered medications for this visit.    REVIEW OF SYSTEMS:  [X]  denotes positive finding, [ ]  denotes negative finding Cardiac  Comments:  Chest pain or chest pressure: ***   Shortness of breath upon exertion:    Short of breath when lying flat:    Irregular heart rhythm:        Vascular    Pain in calf, thigh, or hip brought on by ambulation:    Pain in feet at night that wakes you up from your sleep:     Blood clot in your veins:    Leg swelling:         Pulmonary    Oxygen at home:    Productive cough:     Wheezing:         Neurologic    Sudden weakness in arms or legs:     Sudden numbness in arms or legs:     Sudden onset of difficulty speaking or slurred speech:    Temporary loss of vision in one eye:     Problems with dizziness:         Gastrointestinal    Blood in stool:     Vomited blood:         Genitourinary    Burning when urinating:     Blood in urine:        Psychiatric    Major depression:         Hematologic    Bleeding problems:    Problems with blood clotting too easily:        Skin    Rashes or ulcers:        Constitutional    Fever or chills:      PHYSICAL EXAM There were no vitals filed for this visit.  Constitutional: *** appearing. *** distress. Appears *** nourished.  Neurologic: CN ***. *** focal findings. *** sensory loss. Psychiatric: *** Mood and affect symmetric and appropriate. Eyes: *** No icterus. No conjunctival pallor. Ears, nose, throat: *** mucous membranes moist. Midline trachea.  Cardiac: *** rate and rhythm.  Respiratory: *** unlabored. Abdominal: *** soft, non-tender,  non-distended.  Peripheral vascular: *** Extremity: *** edema. *** cyanosis. *** pallor.  Skin: *** gangrene. *** ulceration.  Lymphatic: *** Stemmer's sign. *** palpable lymphadenopathy.  PERTINENT LABORATORY AND RADIOLOGIC DATA  Most recent CBC CBC Latest Ref Rng & Units 04/22/2015 01/21/2015 10/11/2014  WBC 3.8 -  10.6 K/uL 3.6(L) 4.5 6.4  Hemoglobin 13.0 - 18.0 g/dL 11.3(L) 11.5(L) 13.5  Hematocrit 40.0 - 52.0 % 36.9(L) 37.3(L) 43.0  Platelets 150 - 440 K/uL 188 186 204     Most recent CMP CMP Latest Ref Rng & Units 04/22/2015 02/04/2015 01/21/2015  Glucose 65 - 99 mg/dL 109(H) - 110(H)  BUN 6 - 20 mg/dL 47(H) - 39(H)  Creatinine 0.61 - 1.24 mg/dL 2.22(H) - 2.13(H)  Sodium 135 - 145 mmol/L 139 - 139  Potassium 3.5 - 5.1 mmol/L 4.1 - 4.1  Chloride 101 - 111 mmol/L 111 - 108  CO2 22 - 32 mmol/L 21(L) - 22  Calcium 8.9 - 10.3 mg/dL 10.0 - 11.0(H)  Total Protein 6.5 - 8.1 g/dL 7.5 7.3 7.9  Total Bilirubin 0.3 - 1.2 mg/dL 2.1(H) 3.1(H) 3.0(H)  Alkaline Phos 38 - 126 U/L 39 37(L) 41  AST 15 - 41 U/L 25 22 22   ALT 17 - 63 U/L 25 24 23     Renal function CrCl cannot be calculated (Patient's most recent lab result is older than the maximum 21 days allowed.).  No results found for: HGBA1C  No results found for: LDLCALC, LDLC, HIRISKLDL, POCLDL, LDLDIRECT, REALLDLC, TOTLDLC   Vascular Imaging: ***  Daiquan Resnik N. Stanford Breed, MD [facility] Phone Number: [phone] 06/02/2021 8:01 PM  Total time spent on preparing this encounter including chart review, data review, collecting history, examining the patient, coordinating care for this {tnhtimebilling:26202}  Portions of this report may have been transcribed using voice recognition software.  Every effort has been made to ensure accuracy; however, inadvertent computerized transcription errors may still be present.

## 2021-06-03 ENCOUNTER — Encounter (HOSPITAL_COMMUNITY): Payer: Medicare Other

## 2021-06-03 ENCOUNTER — Other Ambulatory Visit (HOSPITAL_COMMUNITY): Payer: Medicare Other

## 2021-06-03 ENCOUNTER — Encounter: Payer: Medicare Other | Admitting: Vascular Surgery

## 2021-07-02 ENCOUNTER — Ambulatory Visit (INDEPENDENT_AMBULATORY_CARE_PROVIDER_SITE_OTHER): Payer: Medicare Other | Admitting: Vascular Surgery

## 2021-07-02 ENCOUNTER — Other Ambulatory Visit: Payer: Self-pay

## 2021-07-02 ENCOUNTER — Encounter: Payer: Self-pay | Admitting: Vascular Surgery

## 2021-07-02 ENCOUNTER — Ambulatory Visit (INDEPENDENT_AMBULATORY_CARE_PROVIDER_SITE_OTHER)
Admission: RE | Admit: 2021-07-02 | Discharge: 2021-07-02 | Disposition: A | Discharge status: Home / Self Care | Payer: Medicare Other | Source: Ambulatory Visit | Attending: Vascular Surgery | Admitting: Vascular Surgery

## 2021-07-02 ENCOUNTER — Ambulatory Visit (HOSPITAL_COMMUNITY)
Admission: RE | Admit: 2021-07-02 | Discharge: 2021-07-02 | Disposition: A | Discharge status: Home / Self Care | Payer: Medicare Other | Source: Ambulatory Visit | Attending: Vascular Surgery | Admitting: Vascular Surgery

## 2021-07-02 VITALS — BP 135/73 | HR 57 | Temp 97.7°F | Resp 20 | Ht 69.0 in | Wt 195.0 lb

## 2021-07-02 DIAGNOSIS — N189 Chronic kidney disease, unspecified: Secondary | ICD-10-CM | POA: Diagnosis not present

## 2021-07-02 NOTE — Progress Notes (Signed)
Patient ID: Nathan Knapp, male   DOB: 1951/05/14, 70 y.o.   MRN: [id number]  Reason for Consult: New Patient (Initial Visit)   Referred by [name], PA-C  Subjective:     HPI:  Nathan Knapp is a 70 y.o. male with chronic kidney disease.  He is left-hand-dominant.  He has never had any surgery in the past.  No injuries to his bilateral upper extremities or chest.  He does not take any blood thinners.  He has never been on dialysis before.  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 Swallow whole.     bimatoprost (LUMIGAN) 0.03 % ophthalmic solution Place 1 drop into both eyes at bedtime. Reported on 12/11/2015     brimonidine-timolol (COMBIGAN) 0.2-0.5 % ophthalmic solution Place 1 drop into both eyes every 12 (twelve) hours.     Brinzolamide-Brimonidine 1-0.2 % SUSP Place 1 drop into both eyes daily.       DIPHENHYDRAMINE HCL PO Take 10 mg by mouth daily at 8 pm.     furosemide (LASIX) 40 MG tablet Take 40 mg by mouth.     hydrochlorothiazide (HYDRODIURIL) 25 MG tablet Take 25 mg by mouth daily. Reported on 12/11/2015     losartan (COZAAR) 100 MG tablet Take 100 mg by mouth daily.      metoprolol succinate (TOPROL-XL) 50 MG 24 hr tablet Take 50 mg by mouth daily.      pravastatin (PRAVACHOL) 40 MG tablet Take 40 mg by mouth daily.      RHOPRESSA 0.02 % SOLN Place 1 drop into both eyes daily.      timolol (TIMOPTIC) 0.25 % ophthalmic solution Place 1 drop into both eyes 2 (two) times daily.      traZODone (DESYREL) 50 MG tablet Take 50 mg by mouth at bedtime as needed.      zolpidem (AMBIEN) 5 MG tablet Take 5 mg by mouth at bedtime as needed.      No current facility-administered medications for this visit.    Review of Systems  Constitutional:  Constitutional negative. HENT: HENT negative.  Eyes: Eyes negative.  Respiratory: Respiratory negative.  Cardiovascular: Cardiovascular negative.  GI: Gastrointestinal negative.  Musculoskeletal: Musculoskeletal negative.  Skin: Skin negative.  Neurological: Neurological negative. Hematologic: Hematologic/lymphatic negative.  Psychiatric: Psychiatric negative.       Objective:  Objective   Vitals:   07/02/21 1214  BP: 135/73  Pulse: (!) 57  Resp: 20  Temp: 97.7 F (36.5 C)  SpO2: 98%  Weight: 195 lb (88.5 kg)  Height: 5\' 9"  (1.753 m)   Body mass index is 28.8 kg/m.  Physical Exam HENT:     Head: Normocephalic.     Nose:     Comments: Wearing a mask Eyes:     Pupils: Pupils are equal, round, and reactive to light.  Cardiovascular:     Pulses:          Radial pulses are 2+ on the right side and 2+ on the left side.  Pulmonary:     Effort: Pulmonary effort is normal.  Abdominal:     General: Abdomen is flat.     Palpations: Abdomen is soft.  Musculoskeletal:        General: Normal range of motion.  Skin:    General:  Skin is warm and dry.     Capillary Refill: Capillary refill takes less than 2 seconds.  Neurological:     General: No focal deficit present.     Mental Status: He is alert.  Psychiatric:        Mood and Affect: Mood normal.        Behavior: Behavior normal.        Thought Content: Thought content normal.    Data: +-----------------+-------------+----------+---------+  Right Cephalic   Diameter (cm)Depth (cm)Findings   +-----------------+-------------+----------+---------+  Shoulder             0.24        0.93              +-----------------+-------------+----------+---------+  Prox upper arm       0.28        0.71              +-----------------+-------------+----------+---------+  Mid upper arm        0.24        0.48   branching  +-----------------+-------------+----------+---------+  Dist upper arm       0.23        0.57              +-----------------+-------------+----------+---------+  Antecubital fossa    0.31        0.32              +-----------------+-------------+----------+---------+  Prox forearm         0.24        0.58   branching  +-----------------+-------------+----------+---------+  Mid forearm          0.22        0.47              +-----------------+-------------+----------+---------+  Dist forearm         0.22        0.31              +-----------------+-------------+----------+---------+  Wrist                0.17        0.26              +-----------------+-------------+----------+---------+   +-----------------+-------------+----------+---------+  Right Basilic    Diameter (cm)Depth (cm)Findings   +-----------------+-------------+----------+---------+  Prox upper arm  0.56        1.54              +-----------------+-------------+----------+---------+  Mid upper arm        0.41        1.66   branching  +-----------------+-------------+----------+---------+  Dist upper arm        0.48        1.12              +-----------------+-------------+----------+---------+  Antecubital fossa    0.39        0.94              +-----------------+-------------+----------+---------+  Prox forearm         0.20        0.48   branching  +-----------------+-------------+----------+---------+  Mid forearm          0.16        0.27              +-----------------+-------------+----------+---------+  Distal forearm       0.14        0.30              +-----------------+-------------+----------+---------+  Wrist                0.16        0.25              +-----------------+-------------+----------+---------+   +-----------------+-------------+----------+---------+  Left Cephalic    Diameter (cm)Depth (cm)Findings   +-----------------+-------------+----------+---------+  Shoulder             0.30        0.76              +-----------------+-------------+----------+---------+  Prox upper arm       0.26        0.52              +-----------------+-------------+----------+---------+  Mid upper arm        0.18        0.45              +-----------------+-------------+----------+---------+  Dist upper arm       0.26        0.37              +-----------------+-------------+----------+---------+  Antecubital fossa    0.33        0.32              +-----------------+-------------+----------+---------+  Prox forearm         0.21        0.62              +-----------------+-------------+----------+---------+  Mid forearm          0.22        0.48              +-----------------+-------------+----------+---------+  Dist forearm         0.17        0.31              +-----------------+-------------+----------+---------+  Wrist                0.22        0.30   branching  +-----------------+-------------+----------+---------+   +-----------------+-------------+----------+---------+  Left Basilic     Diameter  (cm)Depth (cm)Findings   +-----------------+-------------+----------+---------+  Mid upper arm        0.39        1.26              +-----------------+-------------+----------+---------+  Dist upper arm       0.35        1.31   branching  +-----------------+-------------+----------+---------+  Antecubital fossa    0.24        0.71   branching  +-----------------+-------------+----------+---------+  Prox forearm         0.16        0.35              +-----------------+-------------+----------+---------+  Mid forearm          0.21        0.28              +-----------------+-------------+----------+---------+  Distal forearm       0.13        0.27              +-----------------+-------------+----------+---------+  Wrist                0.11        0.29   branching  +-----------------+-------------+----------+---------+     Right Pre-Dialysis Findings:  +-----------------------+----------+--------------------+--------+--------+   Location               PSV (cm/s)Intralum. Diam.  (cm)WaveformComments  +-----------------------+----------+--------------------+--------+--------+   Brachial Antecub. fossa93        0.40                biphasic            +-----------------------+----------+--------------------+--------+--------+   Radial Art at Wrist    76        0.24                biphasic            +-----------------------+----------+--------------------+--------+--------+   Ulnar Art at Wrist     61        0.17                biphasic            +-----------------------+----------+--------------------+--------+--------+    Left Pre-Dialysis Findings:  +------------------+----------+--------------------+--------+--------------  ----+  Location          PSV (cm/s)Intralum. Diam. (cm)WaveformComments              +------------------+----------+--------------------+--------+--------------  ----+  Brachial  Antecub. 137       0.49                biphasichigh  bifurcation    fossa                                                   (proximal  upper                                                             arm)                  +------------------+----------+--------------------+--------+--------------  ----+  Radial Art at     78        0.30                biphasic  Wrist                                                                         +------------------+----------+--------------------+--------+--------------  ----+  Ulnar Art at [id number]        0.16                biphasic                      +------------------+----------+--------------------+--------+--------------  ----+       Assessment/Plan:     70 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  We have been asked to place a fistula and wait on graft.  He appears to have suitable basilic vein on the right and he is left-hand dominant.  I discussed with him the options for dialysis including catheter, fistula, graft.  We discussed the risk and benefits of each.  We will plan for fistula in the right upper extremity in the near future.  Patient's questions were answered today.  He states that he will call us to schedule.     Waynetta Sandy MD [facility]: [facility]

## 2021-07-10 ENCOUNTER — Telehealth: Payer: Self-pay

## 2021-07-10 NOTE — Telephone Encounter (Signed)
Received call regarding patient's fistula surgery to ask what kind of surgery it was and what other options there were. Advised that it was access surgery for dialysis, and that it was for a fistula - other options would involve a catheter or graft. They would like to know if they can avoid dialysis. Advised that we would be providing needed access, determination about necessitation of dialysis would come from nephrologist. They are going to call [facility] to see about avoid dialysis and will call back to schedule procedure if needed.

## 2021-07-30 ENCOUNTER — Ambulatory Visit: Payer: Medicare Other | Admitting: Anesthesiology

## 2021-07-30 ENCOUNTER — Encounter: Payer: Self-pay | Admitting: Internal Medicine

## 2021-07-30 ENCOUNTER — Other Ambulatory Visit: Payer: Self-pay

## 2021-07-30 ENCOUNTER — Encounter
Admission: RE | Disposition: A | Discharge status: Home / Self Care | Payer: Self-pay | Source: Home / Self Care | Attending: Internal Medicine

## 2021-07-30 ENCOUNTER — Ambulatory Visit
Admission: RE | Admit: 2021-07-30 | Discharge: 2021-07-30 | Disposition: A | Discharge status: Home / Self Care | Payer: Medicare Other | Attending: Internal Medicine | Admitting: Internal Medicine

## 2021-07-30 DIAGNOSIS — K5730 Diverticulosis of large intestine without perforation or abscess without bleeding: Secondary | ICD-10-CM | POA: Diagnosis not present

## 2021-07-30 DIAGNOSIS — I129 Hypertensive chronic kidney disease with stage 1 through stage 4 chronic kidney disease, or unspecified chronic kidney disease: Secondary | ICD-10-CM | POA: Diagnosis not present

## 2021-07-30 DIAGNOSIS — H548 Legal blindness, as defined in USA: Secondary | ICD-10-CM | POA: Insufficient documentation

## 2021-07-30 DIAGNOSIS — K640 First degree hemorrhoids: Secondary | ICD-10-CM | POA: Insufficient documentation

## 2021-07-30 DIAGNOSIS — E785 Hyperlipidemia, unspecified: Secondary | ICD-10-CM | POA: Insufficient documentation

## 2021-07-30 DIAGNOSIS — Z7982 Long term (current) use of aspirin: Secondary | ICD-10-CM | POA: Insufficient documentation

## 2021-07-30 DIAGNOSIS — D123 Benign neoplasm of transverse colon: Secondary | ICD-10-CM | POA: Diagnosis not present

## 2021-07-30 DIAGNOSIS — Z79899 Other long term (current) drug therapy: Secondary | ICD-10-CM | POA: Insufficient documentation

## 2021-07-30 DIAGNOSIS — N189 Chronic kidney disease, unspecified: Secondary | ICD-10-CM | POA: Insufficient documentation

## 2021-07-30 DIAGNOSIS — R195 Other fecal abnormalities: Secondary | ICD-10-CM | POA: Diagnosis present

## 2021-07-30 HISTORY — DX: Insomnia, unspecified: G47.00

## 2021-07-30 HISTORY — DX: Hypercalcemia: E83.52

## 2021-07-30 HISTORY — DX: Allergic rhinitis, unspecified: J30.9

## 2021-07-30 HISTORY — DX: Retinal artery branch occlusion, left eye: H34.232

## 2021-07-30 HISTORY — DX: Thalassemia minor: D56.3

## 2021-07-30 HISTORY — DX: Unspecified osteoarthritis, unspecified site: M19.90

## 2021-07-30 HISTORY — PX: COLONOSCOPY WITH PROPOFOL: SHX5780

## 2021-07-30 SURGERY — COLONOSCOPY WITH PROPOFOL
Anesthesia: General

## 2021-07-30 MED ORDER — LIDOCAINE HCL (CARDIAC) PF 100 MG/5ML IV SOSY
PREFILLED_SYRINGE | INTRAVENOUS | Status: DC | PRN
  Administered 2021-07-30: 50 mg via INTRAVENOUS

## 2021-07-30 MED ORDER — SODIUM CHLORIDE 0.9 % IV SOLN: INTRAVENOUS | Status: DC

## 2021-07-30 MED ORDER — PHENYLEPHRINE HCL (PRESSORS) 10 MG/ML IV SOLN
INTRAVENOUS | Status: DC | PRN
  Administered 2021-07-30 (×2): 100 ug via INTRAVENOUS

## 2021-07-30 MED ORDER — PROPOFOL 10 MG/ML IV BOLUS
INTRAVENOUS | Status: DC | PRN
  Administered 2021-07-30: 70 mg via INTRAVENOUS

## 2021-07-30 MED ORDER — PROPOFOL 500 MG/50ML IV EMUL
INTRAVENOUS | Status: DC | PRN
  Administered 2021-07-30: 150 ug/kg/min via INTRAVENOUS

## 2021-07-30 NOTE — Transfer of Care (Signed)
Immediate Anesthesia Transfer of Care Note  Patient: Nathan Knapp  Procedure(s) Performed: COLONOSCOPY WITH PROPOFOL  Patient Location: PACU and Endoscopy Unit  Anesthesia Type:General  Level of Consciousness: drowsy  Airway & Oxygen Therapy: Patient Spontanous Breathing  Post-op Assessment: Report given to RN and Post -op Vital signs reviewed and stable  Post vital signs: Reviewed and stable  Last Vitals:  Vitals Value Taken Time  BP 92/44 07/30/21 1441  Temp 36.8 C 07/30/21 1440  Pulse 69 07/30/21 1442  Resp 8 07/30/21 1442  SpO2 98 % 07/30/21 1442  Vitals shown include unvalidated device data.  Last Pain:  Vitals:   07/30/21 1440  TempSrc: Temporal  PainSc: Asleep         Complications: No notable events documented.

## 2021-07-30 NOTE — Interval H&P Note (Signed)
History and Physical Interval Note:  07/30/2021 1:39 PM  Nathan Knapp  has presented today for surgery, with the diagnosis of + COLORECTAL  [place].  The various methods of treatment have been discussed with the patient and family. After consideration of risks, benefits and other options for treatment, the patient has consented to  Procedure(s): COLONOSCOPY WITH PROPOFOL (N/A) as a surgical intervention.  The patient's history has been reviewed, patient examined, no change in status, stable for surgery.  I have reviewed the patient's chart and labs.  Questions were answered to the patient's satisfaction.     Kinder, [place]

## 2021-07-30 NOTE — Anesthesia Procedure Notes (Signed)
Procedure Name: MAC Date/Time: 07/30/2021 2:14 PM Performed by: Biagio Borg, CRNA Pre-anesthesia Checklist: Patient identified, Emergency Drugs available, Suction available, Patient being monitored and Timeout performed Patient Re-evaluated:Patient Re-evaluated prior to induction Oxygen Delivery Method: Nasal cannula Induction Type: IV induction Placement Confirmation: positive ETCO2 and CO2 detector

## 2021-07-30 NOTE — Anesthesia Preprocedure Evaluation (Signed)
Anesthesia Evaluation  Patient identified by MRN, date of birth, ID band Patient awake    Reviewed: Allergy & Precautions, NPO status , Patient's Chart, lab work & pertinent test results  History of Anesthesia Complications Negative for: history of anesthetic complications  Airway Mallampati: II  TM Distance: >3 FB Neck ROM: Full    Dental  (+) Edentulous Upper, Edentulous Lower   Pulmonary neg sleep apnea, neg COPD, former smoker,    breath sounds clear to auscultation- rhonchi (-) wheezing      Cardiovascular hypertension, Pt. on medications (-) CAD, (-) Past MI, (-) Cardiac Stents and (-) CABG  Rhythm:Regular Rate:Normal - Systolic murmurs and - Diastolic murmurs    Neuro/Psych neg Seizures negative neurological ROS  negative psych ROS   GI/Hepatic negative GI ROS, Neg liver ROS,   Endo/Other  negative endocrine ROSneg diabetes  Renal/GU CRFRenal disease     Musculoskeletal  (+) Arthritis ,   Abdominal (+) - obese,   Peds  Hematology  (+) anemia ,   Anesthesia Other Findings Past Medical History: No date: Allergic rhinitis No date: Arthritis No date: Atypical lymphocytosis No date: Beta thalassemia minor No date: Chronic kidney disease No date: Elevated PSA No date: Glaucoma No date: History of kidney stones No date: HTN (hypertension) No date: Hyperbilirubinemia No date: Hypercalcemia No date: Hyperlipidemia No date: Hyperparathyroidism ([place]) No date: Inguinal hernia No date: Insomnia No date: Legally blind 04/22/2015: Malignant neoplasm of prostate ([place]) No date: Microcytosis No date: Nodular prostate No date: 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 No date: Vitamin D deficiency   Reproductive/Obstetrics                             Anesthesia Physical Anesthesia Plan  ASA: 3  Anesthesia Plan: General   Post-op Pain Management:    Induction:  Intravenous  PONV Risk Score and Plan: 1 and Propofol infusion  Airway Management Planned: Natural Airway  Additional Equipment:   Intra-op Plan:   Post-operative Plan:   Informed Consent: I have reviewed the patients History and Physical, chart, labs and discussed the procedure including the risks, benefits and alternatives for the proposed anesthesia with the patient or authorized representative who has indicated his/her understanding and acceptance.     Dental advisory given  Plan Discussed with: CRNA and Anesthesiologist  Anesthesia Plan Comments:         Anesthesia Quick Evaluation

## 2021-07-30 NOTE — Op Note (Signed)
[facility] Gastroenterology Patient Name: Nathan Knapp Procedure Date: 07/30/2021 2:08 PM MRN: [id number] Account #: [id number] Date of Birth: 07-22-1951 Admit Type: Outpatient Age: 70 Room: [facility] ENDO ROOM 2 Gender: Male Note Status: Finalized Instrument Name: Colonoscope [id number] Procedure:             Colonoscopy Indications:           Positive Cologuard test Providers:             [name]. Alice Reichert MD, MD Referring MD:          [name] (Referring MD) Medicines:             Propofol per Anesthesia Complications:         No immediate complications. Procedure:             Pre-Anesthesia Assessment:                        - The risks and benefits of the procedure and the                         sedation options and risks were discussed with the                         patient. All questions were answered and informed                         consent was obtained.                        - Patient identification and proposed procedure were                         verified prior to the procedure by the nurse. The                         procedure was verified in the procedure room.                        - ASA Grade Assessment: III - A patient with severe                         systemic disease.                        - After reviewing the risks and benefits, the patient                         was deemed in satisfactory condition to undergo the                         procedure.                        After obtaining informed consent, the colonoscope was                         passed under direct vision. Throughout the procedure,                         the patient's blood pressure,  pulse, and oxygen                         saturations were monitored continuously. The                         Colonoscope was introduced through the anus and                         advanced to the the cecum, identified by appendiceal                         orifice and ileocecal  valve. The colonoscopy was                         performed without difficulty. The patient tolerated                         the procedure well. The quality of the bowel                         preparation was fair. Findings:      The perianal and digital rectal examinations were normal. Pertinent       negatives include normal sphincter tone and no palpable rectal lesions.      Non-bleeding internal hemorrhoids were found during retroflexion. The       hemorrhoids were Grade I (internal hemorrhoids that do not prolapse).      Multiple small and large-mouthed diverticula were found in the sigmoid       colon. There was no evidence of diverticular bleeding.      A large amount of semi-solid stool was found in the rectum, in the       sigmoid colon, in the ascending colon and in the cecum, interfering with       visualization. Lavage of the area was performed using a moderate amount       of sterile water, resulting in clearance with fair visualization.      Three semi-pedunculated polyps were found in the transverse colon. The       polyps were 7 to 10 mm in size. These polyps were removed with a hot       snare. Resection and retrieval were complete.      A 3 mm polyp was found in the transverse colon. The polyp was sessile.       The polyp was removed with a cold biopsy forceps. Resection and       retrieval were complete.      A 8 mm polyp was found in the sigmoid colon. The polyp was       semi-pedunculated. The polyp was removed with a hot snare. Resection and       retrieval were complete.      Non-bleeding internal hemorrhoids were found during retroflexion. The       hemorrhoids were Grade I (internal hemorrhoids that do not prolapse).      The exam was otherwise without abnormality. Impression:            - Preparation of the colon was fair.                        - Non-bleeding internal hemorrhoids.                        -  Moderate diverticulosis in the sigmoid colon. There                          was no evidence of diverticular bleeding.                        - Stool in the rectum, in the sigmoid colon, in the                         ascending colon and in the cecum.                        - Three 7 to 10 mm polyps in the transverse colon,                         removed with a hot snare. Resected and retrieved.                        - One 3 mm polyp in the transverse colon, removed with                         a cold biopsy forceps. Resected and retrieved.                        - One 8 mm polyp in the sigmoid colon, removed with a                         hot snare. Resected and retrieved.                        - Non-bleeding internal hemorrhoids.                        - The examination was otherwise normal. Recommendation:        - Patient has a contact number available for                         emergencies. The signs and symptoms of potential                         delayed complications were discussed with the patient.                         Return to normal activities tomorrow. Written                         discharge instructions were provided to the patient.                        - Resume previous diet.                        - Continue present medications.                        - Await pathology results.                        - Repeat colonoscopy in  1 year for surveillance.                        - Return to GI office PRN.                        - I advise 'pre-prep' of colon for two weeks prior to                         next scheduled colonoscopy. Take Miralax 17g (one                         capful) daily mixed in liquid of choice daily for two                         weeks leading up to the procedure IN ADDITION to the                         Large volume prep the day prior to the procedure.                        - The findings and recommendations were discussed with                         the patient. Procedure Code(s):     ---  Professional ---                        [phone], Colonoscopy, flexible; with removal of                         tumor(s), polyp(s), or other lesion(s) by snare                         technique                        45380, 48, Colonoscopy, flexible; with biopsy, single                         or multiple Diagnosis Code(s):     --- Professional ---                        K57.30, Diverticulosis of large intestine without                         perforation or abscess without bleeding                        R19.5, Other fecal abnormalities                        K63.5, Polyp of colon                        K64.0, First degree hemorrhoids CPT copyright 2019 [employer]. All rights reserved. The codes documented in this report are preliminary and upon coder review may  be revised to meet current compliance requirements. Efrain Sella MD, MD 07/30/2021 2:45:36 PM This report has been signed electronically. Number  of Addenda: 0 Note Initiated On: 07/30/2021 2:08 PM Scope Withdrawal Time: 0 hours 8 minutes 0 seconds  Total Procedure Duration: 0 hours 16 minutes 13 seconds  Estimated Blood Loss:  Estimated blood loss: none.      [facility]

## 2021-07-30 NOTE — H&P (Signed)
Outpatient short stay form Pre-procedure 07/30/2021 1:38 PM Nathan Knapp, M.D.  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 Patient denies change in bowel habits, rectal bleeding, weight loss or abdominal pain.       Current Facility-Administered Medications:    0.9 %  sodium chloride infusion, , Intravenous, Continuous, Nathan Knapp, Nathan Pike, MD  Medications Prior to Admission  Medication Sig Dispense Refill Last Dose   aspirin EC 81 MG tablet Take 81 mg by mouth daily. Swallow whole.      brimonidine-timolol (COMBIGAN) 0.2-0.5 % ophthalmic solution Place 1 drop into both eyes every 12 (twelve) hours.      DIPHENHYDRAMINE HCL PO Take 10 mg by mouth daily at 8 pm.      amLODipine (NORVASC) 5 MG tablet       bimatoprost (LUMIGAN) 0.03 % ophthalmic solution Place 1 drop into both eyes at bedtime. Reported on 12/11/2015      Brinzolamide-Brimonidine 1-0.2 % SUSP Place 1 drop into both eyes daily.       furosemide (LASIX) 40 MG tablet Take 40 mg by mouth.      hydrochlorothiazide (HYDRODIURIL) 25 MG tablet Take 25 mg by mouth daily. Reported on 12/11/2015      losartan (COZAAR) 100 MG tablet Take 100 mg by mouth daily.       metoprolol succinate (TOPROL-XL) 50 MG 24 hr tablet Take 50 mg by mouth daily.       pravastatin (PRAVACHOL) 40 MG tablet Take 40 mg by mouth daily.       RHOPRESSA 0.02 % SOLN Place 1 drop into both eyes daily.       timolol (TIMOPTIC) 0.25 % ophthalmic solution Place 1 drop into both eyes 2 (two) times daily.       traZODone (DESYREL) 50 MG tablet Take 50 mg by mouth at bedtime as needed.       zolpidem (AMBIEN) 5 MG tablet Take 5 mg by mouth at bedtime as needed.         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    Physical Exam  Gen: Alert, oriented. Appears stated age.  HEENT: [place]/AT. PERRLA. Lungs: CTA, no wheezes. CV: RR nl S1, S2. Abd: soft, benign, no masses. BS+ Ext: No edema. Pulses 2+    Planned procedures: Proceed with colonoscopy. The patient understands the nature of the planned procedure, indications, risks, alternatives and potential complications including but not limited to bleeding, infection, perforation, damage to internal organs and possible oversedation/side effects from anesthesia. The patient agrees and gives consent to proceed.  Please  refer to procedure notes for findings, recommendations and patient disposition/instructions.     Nathan Knapp, M.D. Gastroenterology 07/30/2021  1:38 PM

## 2021-07-31 ENCOUNTER — Encounter: Payer: Self-pay | Admitting: Internal Medicine

## 2021-08-01 LAB — SURGICAL PATHOLOGY

## 2021-08-01 NOTE — Anesthesia Postprocedure Evaluation (Signed)
Anesthesia Post Note  Patient: Nathan Knapp  Procedure(s) Performed: COLONOSCOPY WITH PROPOFOL  Patient location during evaluation: Endoscopy Anesthesia Type: General Level of consciousness: awake and alert and oriented Pain management: pain level controlled Vital Signs Assessment: post-procedure vital signs reviewed and stable Respiratory status: spontaneous breathing, nonlabored ventilation and respiratory function stable Cardiovascular status: blood pressure returned to baseline and stable Postop Assessment: no signs of nausea or vomiting Anesthetic complications: no   No notable events documented.   Last Vitals:  Vitals:   07/30/21 1450 07/30/21 1500  BP:    Pulse:    Resp: 20 20  Temp:    SpO2:      Last Pain:  Vitals:   07/31/21 0747  TempSrc:   PainSc: 0-No pain                 Ermine Stebbins

## 2021-11-14 ENCOUNTER — Other Ambulatory Visit: Payer: Self-pay

## 2021-12-01 ENCOUNTER — Other Ambulatory Visit: Payer: Self-pay

## 2021-12-01 ENCOUNTER — Telehealth: Payer: Self-pay

## 2021-12-01 NOTE — Pre-Procedure Instructions (Signed)
? ? Joshoa Shawler ? 12/01/2021  ?  ?  ?[id number] Lorina Rabon, [facility] ?[address] ?[place] [address] ?Phone: [phone] Fax: [phone] ? ?[place], [place] ?[address] ?[place] [address] ?Phone: [phone] Fax: [phone] ? ? ?PCP - [name], PA ? ?Aspirin Instructions: Take DOS ? ?ERAS Protcol - NPO ? ?COVID TEST- Y ? ?Anesthesia review: N ? ?Patient verbally denies any shortness of breath, fever, cough and chest pain during phone call ? ? ?-------------  SDW INSTRUCTIONS given: ? ?Your procedure is scheduled on 12/02/21. ? Report to [facility] Main Entrance "A" at 0800 A.M., and check in at the Admitting office. ? Call this number if you have problems the morning of surgery: ? [phone] ? ? Remember: ? Do not eat after midnight the night before your surgery ?  ? Take these medicines the morning of surgery with A SIP OF WATER  ?Amlodipine ?Metroprol ?Aspirin ? ?As of today, STOP taking any Aspirin (unless otherwise instructed by your surgeon) Aleve, Naproxen, Ibuprofen, Motrin, Advil, Goody's, BC's, all herbal medications, fish oil, and all vitamins. ? ?         ?           Do not wear jewelry, make up, or nail polish ?           Do not wear lotions, powders, perfumes/colognes, or deodorant. ?           Do not shave 48 hours prior to surgery.  Men may shave face and neck. ?           Do not bring valuables to the hospital. ?           [facility] is not responsible for any belongings or valuables. ? ?Do NOT Smoke (Tobacco/Vaping) 24 hours prior to your procedure ?If you use a CPAP at night, you may bring all equipment for your overnight stay. ?  ?Contacts, glasses, dentures or bridgework may not be worn into surgery.    ?  ?For patients admitted to the hospital, discharge time will be determined by your treatment team. ?  ?Patients discharged the day of surgery will not be allowed to drive home, and  someone needs to stay with them for 24 hours. ? ? ? ?Special instructions:   ?[facility]- Preparing For Surgery ? ?Before surgery, you can play an important role. Because skin is not sterile, your skin needs to be as free of germs as possible. You can reduce the number of germs on your skin by washing with CHG (chlorahexidine gluconate) Soap before surgery.  CHG is an antiseptic cleaner which kills germs and bonds with the skin to continue killing germs even after washing.   ? ?Oral Hygiene is also important to reduce your risk of infection.  Remember - BRUSH YOUR TEETH THE MORNING OF SURGERY WITH YOUR REGULAR TOOTHPASTE ? ?Please do not use if you have an allergy to CHG or antibacterial soaps. If your skin becomes reddened/irritated stop using the CHG.  ?Do not shave (including legs and underarms) for at least 48 hours prior to first CHG shower. It is OK to shave your face. ? ?Please follow these instructions carefully. ?  ?Shower the NIGHT BEFORE SURGERY and the MORNING OF SURGERY with DIAL Soap.  ? ?Pat yourself dry with a CLEAN TOWEL. ? ?Wear CLEAN PAJAMAS to bed the night  before surgery ? ?Place CLEAN SHEETS on your bed the night of your first shower and DO NOT SLEEP WITH PETS. ? ? ?Day of Surgery: ?Please shower morning of surgery  ?Wear Clean/Comfortable clothing the morning of surgery ?Do not apply any deodorants/lotions.   ?Remember to brush your teeth WITH YOUR REGULAR TOOTHPASTE. ?  ?Questions were answered. Patient verbalized understanding of instructions.  ? ? ?   ? ?

## 2021-12-01 NOTE — Telephone Encounter (Signed)
Received a call from Manuela Schwartz with [facility] pre-admission informing that patient advised her that he does not have anyone to stay with him on tomorrow post surgery and also that he had transportation issues with getting to hospital by appointed arrival time.  ? ?Permission given per Dr. Donzetta Matters for overnight observation. Patient informed and verified understanding. Pt stated he has been having difficulty reaching his transportation. He agreed to be contacting [facility] transportation for assistance. Spoke with Suezanne Jacquet, who set up transportation for patient on 3/7 with a pick up time of 8:23AM for an arrival time of 0930AM. Informed patient of this information. Patient's address also updated.  ?

## 2021-12-02 ENCOUNTER — Encounter (HOSPITAL_COMMUNITY): Payer: Self-pay | Admitting: Certified Registered"

## 2021-12-02 ENCOUNTER — Encounter (HOSPITAL_COMMUNITY): Admission: RE | Payer: Self-pay | Source: Ambulatory Visit

## 2021-12-02 ENCOUNTER — Ambulatory Visit (HOSPITAL_COMMUNITY): Admission: RE | Admit: 2021-12-02 | Payer: Medicare Other | Source: Ambulatory Visit | Admitting: Vascular Surgery

## 2021-12-02 SURGERY — ARTERIOVENOUS (AV) FISTULA CREATION
Anesthesia: Choice | Laterality: Right

## 2021-12-03 NOTE — Telephone Encounter (Signed)
Spoke with Estill Bamberg at [facility] who advised patient missed surgery appointment on yesterday because he did not recognize the [place] car when it came.  ? ?Attempted to reschedule patient for surgery. Pt stated he will leaving to go out of town tomorrow and unsure when he will return. Provided patient with contact information to call when ready to schedule.  ?

## 2022-07-14 ENCOUNTER — Telehealth: Payer: Self-pay

## 2022-07-14 NOTE — Telephone Encounter (Signed)
Received a voicemail from Dr. Joylene Grapes [facility] requesting to reschedule patient for fistula creation or re-evaluation for placement, which was previously canceled due to transportation issues. Patient last seen in office 07/02/2021. Will have patient scheduled for office with studies if indicated per Dr. Donzetta Matters.

## 2022-07-14 NOTE — Telephone Encounter (Signed)
Per Dr. Donzetta Matters, patient can just be scheduled for surgery.

## 2022-07-16 ENCOUNTER — Other Ambulatory Visit: Payer: Self-pay

## 2022-07-16 DIAGNOSIS — N184 Chronic kidney disease, stage 4 (severe): Secondary | ICD-10-CM

## 2022-07-23 ENCOUNTER — Other Ambulatory Visit: Payer: Self-pay

## 2022-07-23 ENCOUNTER — Encounter (HOSPITAL_COMMUNITY): Payer: Self-pay | Admitting: Vascular Surgery

## 2022-07-23 NOTE — Progress Notes (Addendum)
Mr. Nathan Knapp denies chest pain or shortness of breath.  Patient denies having any s/s of Covid in his household, also denies any known exposure to Covid.  Mr. Nathan Knapp said that he does not have anyone to stay with him for 24 hours after surgery. I left a message on the nurse call line ar [facility].  Mr. Nathan Knapp PCP is Raphael Gibney, PA-C.

## 2022-07-24 ENCOUNTER — Telehealth: Payer: Self-pay

## 2022-07-24 ENCOUNTER — Ambulatory Visit (HOSPITAL_COMMUNITY): Admission: RE | Admit: 2022-07-24 | Payer: Medicare Other | Source: Ambulatory Visit | Admitting: Vascular Surgery

## 2022-07-24 SURGERY — ARTERIOVENOUS (AV) FISTULA CREATION
Anesthesia: Choice | Laterality: Right

## 2022-07-24 NOTE — Telephone Encounter (Signed)
Received a telephone message from Jan G., RN at [facility] preadmission advising that patient did not have anyone to stay with him after surgery.   I had offered patient overnight observation stay when he was scheduled for surgery, but he declined, stating "I'll see what I can could do." Patient was advised at that time to contact office back if unable to make arrangements.  Attempted to reach patient to provide this offer again post procedure, if he had remained NPO after midnight and still able to get to hospital but no answer. OR desk and provider notified of surgery cancellation.

## 2022-12-03 DIAGNOSIS — I10 Essential (primary) hypertension: Secondary | ICD-10-CM | POA: Diagnosis not present

## 2022-12-03 DIAGNOSIS — Z139 Encounter for screening, unspecified: Secondary | ICD-10-CM | POA: Diagnosis not present

## 2022-12-03 DIAGNOSIS — R609 Edema, unspecified: Secondary | ICD-10-CM | POA: Diagnosis not present

## 2022-12-03 DIAGNOSIS — G4700 Insomnia, unspecified: Secondary | ICD-10-CM | POA: Diagnosis not present

## 2022-12-03 DIAGNOSIS — Z9181 History of falling: Secondary | ICD-10-CM | POA: Diagnosis not present

## 2022-12-03 DIAGNOSIS — E1122 Type 2 diabetes mellitus with diabetic chronic kidney disease: Secondary | ICD-10-CM | POA: Diagnosis not present

## 2022-12-03 DIAGNOSIS — N185 Chronic kidney disease, stage 5: Secondary | ICD-10-CM | POA: Diagnosis not present

## 2022-12-03 DIAGNOSIS — E7800 Pure hypercholesterolemia, unspecified: Secondary | ICD-10-CM | POA: Diagnosis not present

## 2022-12-03 DIAGNOSIS — Z23 Encounter for immunization: Secondary | ICD-10-CM | POA: Diagnosis not present

## 2022-12-09 DIAGNOSIS — H2511 Age-related nuclear cataract, right eye: Secondary | ICD-10-CM | POA: Diagnosis not present

## 2023-01-04 DIAGNOSIS — N185 Chronic kidney disease, stage 5: Secondary | ICD-10-CM | POA: Diagnosis not present

## 2023-01-04 DIAGNOSIS — N2581 Secondary hyperparathyroidism of renal origin: Secondary | ICD-10-CM | POA: Diagnosis not present

## 2023-01-04 DIAGNOSIS — E1122 Type 2 diabetes mellitus with diabetic chronic kidney disease: Secondary | ICD-10-CM | POA: Diagnosis not present

## 2023-01-04 DIAGNOSIS — I120 Hypertensive chronic kidney disease with stage 5 chronic kidney disease or end stage renal disease: Secondary | ICD-10-CM | POA: Diagnosis not present

## 2023-02-19 DIAGNOSIS — G4700 Insomnia, unspecified: Secondary | ICD-10-CM | POA: Diagnosis not present

## 2023-02-19 DIAGNOSIS — E1122 Type 2 diabetes mellitus with diabetic chronic kidney disease: Secondary | ICD-10-CM | POA: Diagnosis not present

## 2023-02-19 DIAGNOSIS — E7800 Pure hypercholesterolemia, unspecified: Secondary | ICD-10-CM | POA: Diagnosis not present

## 2023-02-19 DIAGNOSIS — G3184 Mild cognitive impairment, so stated: Secondary | ICD-10-CM | POA: Diagnosis not present

## 2023-02-19 DIAGNOSIS — I10 Essential (primary) hypertension: Secondary | ICD-10-CM | POA: Diagnosis not present

## 2023-02-19 DIAGNOSIS — N185 Chronic kidney disease, stage 5: Secondary | ICD-10-CM | POA: Diagnosis not present

## 2023-02-19 DIAGNOSIS — H547 Unspecified visual loss: Secondary | ICD-10-CM | POA: Diagnosis not present

## 2023-02-19 DIAGNOSIS — R609 Edema, unspecified: Secondary | ICD-10-CM | POA: Diagnosis not present

## 2023-02-19 DIAGNOSIS — Z7409 Other reduced mobility: Secondary | ICD-10-CM | POA: Diagnosis not present

## 2023-03-05 DIAGNOSIS — I120 Hypertensive chronic kidney disease with stage 5 chronic kidney disease or end stage renal disease: Secondary | ICD-10-CM | POA: Diagnosis not present

## 2023-03-05 DIAGNOSIS — N2581 Secondary hyperparathyroidism of renal origin: Secondary | ICD-10-CM | POA: Diagnosis not present

## 2023-03-05 DIAGNOSIS — N185 Chronic kidney disease, stage 5: Secondary | ICD-10-CM | POA: Diagnosis not present

## 2023-03-05 DIAGNOSIS — D631 Anemia in chronic kidney disease: Secondary | ICD-10-CM | POA: Diagnosis not present

## 2023-03-05 DIAGNOSIS — E1122 Type 2 diabetes mellitus with diabetic chronic kidney disease: Secondary | ICD-10-CM | POA: Diagnosis not present

## 2023-05-07 DIAGNOSIS — H6091 Unspecified otitis externa, right ear: Secondary | ICD-10-CM | POA: Diagnosis not present

## 2023-05-07 DIAGNOSIS — H6991 Unspecified Eustachian tube disorder, right ear: Secondary | ICD-10-CM | POA: Diagnosis not present

## 2023-05-19 DIAGNOSIS — H2513 Age-related nuclear cataract, bilateral: Secondary | ICD-10-CM | POA: Diagnosis not present

## 2023-05-19 DIAGNOSIS — H35033 Hypertensive retinopathy, bilateral: Secondary | ICD-10-CM | POA: Diagnosis not present

## 2023-05-19 DIAGNOSIS — H401133 Primary open-angle glaucoma, bilateral, severe stage: Secondary | ICD-10-CM | POA: Diagnosis not present

## 2023-05-19 DIAGNOSIS — H47012 Ischemic optic neuropathy, left eye: Secondary | ICD-10-CM | POA: Diagnosis not present

## 2023-05-21 DIAGNOSIS — M26629 Arthralgia of temporomandibular joint, unspecified side: Secondary | ICD-10-CM | POA: Diagnosis not present

## 2023-05-21 DIAGNOSIS — G4700 Insomnia, unspecified: Secondary | ICD-10-CM | POA: Diagnosis not present

## 2023-05-21 DIAGNOSIS — E1122 Type 2 diabetes mellitus with diabetic chronic kidney disease: Secondary | ICD-10-CM | POA: Diagnosis not present

## 2023-05-21 DIAGNOSIS — N185 Chronic kidney disease, stage 5: Secondary | ICD-10-CM | POA: Diagnosis not present

## 2023-05-24 DIAGNOSIS — E1122 Type 2 diabetes mellitus with diabetic chronic kidney disease: Secondary | ICD-10-CM | POA: Diagnosis not present

## 2023-05-24 DIAGNOSIS — N185 Chronic kidney disease, stage 5: Secondary | ICD-10-CM | POA: Diagnosis not present

## 2023-05-24 DIAGNOSIS — D631 Anemia in chronic kidney disease: Secondary | ICD-10-CM | POA: Diagnosis not present

## 2023-05-24 DIAGNOSIS — I120 Hypertensive chronic kidney disease with stage 5 chronic kidney disease or end stage renal disease: Secondary | ICD-10-CM | POA: Diagnosis not present

## 2023-05-24 DIAGNOSIS — N2581 Secondary hyperparathyroidism of renal origin: Secondary | ICD-10-CM | POA: Diagnosis not present

## 2023-05-24 DIAGNOSIS — N189 Chronic kidney disease, unspecified: Secondary | ICD-10-CM | POA: Diagnosis not present

## 2024-01-19 DIAGNOSIS — N185 Chronic kidney disease, stage 5: Secondary | ICD-10-CM | POA: Diagnosis not present

## 2024-01-19 DIAGNOSIS — D631 Anemia in chronic kidney disease: Secondary | ICD-10-CM | POA: Diagnosis not present

## 2024-01-19 DIAGNOSIS — I120 Hypertensive chronic kidney disease with stage 5 chronic kidney disease or end stage renal disease: Secondary | ICD-10-CM | POA: Diagnosis not present

## 2024-01-19 DIAGNOSIS — N189 Chronic kidney disease, unspecified: Secondary | ICD-10-CM | POA: Diagnosis not present

## 2024-01-19 DIAGNOSIS — E1122 Type 2 diabetes mellitus with diabetic chronic kidney disease: Secondary | ICD-10-CM | POA: Diagnosis not present

## 2024-01-19 DIAGNOSIS — N2581 Secondary hyperparathyroidism of renal origin: Secondary | ICD-10-CM | POA: Diagnosis not present

## 2024-03-01 DIAGNOSIS — E7800 Pure hypercholesterolemia, unspecified: Secondary | ICD-10-CM | POA: Diagnosis not present

## 2024-03-01 DIAGNOSIS — E1122 Type 2 diabetes mellitus with diabetic chronic kidney disease: Secondary | ICD-10-CM | POA: Diagnosis not present

## 2024-03-01 DIAGNOSIS — N185 Chronic kidney disease, stage 5: Secondary | ICD-10-CM | POA: Diagnosis not present

## 2024-03-01 DIAGNOSIS — Z139 Encounter for screening, unspecified: Secondary | ICD-10-CM | POA: Diagnosis not present

## 2024-03-01 DIAGNOSIS — G4700 Insomnia, unspecified: Secondary | ICD-10-CM | POA: Diagnosis not present

## 2024-03-01 DIAGNOSIS — Z7409 Other reduced mobility: Secondary | ICD-10-CM | POA: Diagnosis not present

## 2024-03-01 DIAGNOSIS — I10 Essential (primary) hypertension: Secondary | ICD-10-CM | POA: Diagnosis not present

## 2024-03-01 DIAGNOSIS — G3184 Mild cognitive impairment, so stated: Secondary | ICD-10-CM | POA: Diagnosis not present

## 2024-03-01 DIAGNOSIS — Z9181 History of falling: Secondary | ICD-10-CM | POA: Diagnosis not present

## 2024-03-01 DIAGNOSIS — M545 Low back pain, unspecified: Secondary | ICD-10-CM | POA: Diagnosis not present

## 2024-03-01 DIAGNOSIS — H547 Unspecified visual loss: Secondary | ICD-10-CM | POA: Diagnosis not present

## 2024-03-17 ENCOUNTER — Encounter: Payer: Self-pay | Admitting: Oncology

## 2024-03-28 DEATH — deceased
